# Patient Record
Sex: Male | Born: 1980 | Race: White | Hispanic: No | Marital: Married | Smoking: Former smoker
Health system: Southern US, Community
[De-identification: ages and names within clinical notes are randomized; demographics above are authoritative.]

## PROBLEM LIST (undated history)

## (undated) DIAGNOSIS — N200 Calculus of kidney: Secondary | ICD-10-CM

## (undated) DIAGNOSIS — I10 Essential (primary) hypertension: Secondary | ICD-10-CM

## (undated) DIAGNOSIS — F319 Bipolar disorder, unspecified: Secondary | ICD-10-CM

## (undated) DIAGNOSIS — N289 Disorder of kidney and ureter, unspecified: Secondary | ICD-10-CM

## (undated) DIAGNOSIS — E119 Type 2 diabetes mellitus without complications: Secondary | ICD-10-CM

---

## 2005-10-21 ENCOUNTER — Emergency Department (HOSPITAL_COMMUNITY): Admission: EM | Admit: 2005-10-21 | Discharge: 2005-10-21 | Payer: Self-pay | Admitting: Emergency Medicine

## 2011-11-20 DIAGNOSIS — F319 Bipolar disorder, unspecified: Secondary | ICD-10-CM | POA: Insufficient documentation

## 2015-10-10 DIAGNOSIS — Z8669 Personal history of other diseases of the nervous system and sense organs: Secondary | ICD-10-CM | POA: Insufficient documentation

## 2015-10-10 DIAGNOSIS — Z8709 Personal history of other diseases of the respiratory system: Secondary | ICD-10-CM | POA: Insufficient documentation

## 2015-10-10 DIAGNOSIS — I10 Essential (primary) hypertension: Secondary | ICD-10-CM | POA: Insufficient documentation

## 2017-04-30 DIAGNOSIS — K219 Gastro-esophageal reflux disease without esophagitis: Secondary | ICD-10-CM | POA: Insufficient documentation

## 2017-06-02 DIAGNOSIS — E119 Type 2 diabetes mellitus without complications: Secondary | ICD-10-CM | POA: Insufficient documentation

## 2017-06-19 ENCOUNTER — Encounter (HOSPITAL_COMMUNITY): Payer: Self-pay | Admitting: Emergency Medicine

## 2017-06-19 ENCOUNTER — Ambulatory Visit (HOSPITAL_COMMUNITY)
Admission: EM | Admit: 2017-06-19 | Discharge: 2017-06-19 | Disposition: A | Discharge status: Home / Self Care | Payer: BLUE CROSS/BLUE SHIELD | Attending: Internal Medicine | Admitting: Internal Medicine

## 2017-06-19 ENCOUNTER — Other Ambulatory Visit: Payer: Self-pay

## 2017-06-19 DIAGNOSIS — B029 Zoster without complications: Secondary | ICD-10-CM

## 2017-06-19 HISTORY — DX: Essential (primary) hypertension: I10

## 2017-06-19 HISTORY — DX: Bipolar disorder, unspecified: F31.9

## 2017-06-19 HISTORY — DX: Type 2 diabetes mellitus without complications: E11.9

## 2017-06-19 HISTORY — DX: Disorder of kidney and ureter, unspecified: N28.9

## 2017-06-19 HISTORY — DX: Calculus of kidney: N20.0

## 2017-06-19 MED ORDER — VALACYCLOVIR HCL 1 G PO TABS: 1000.0000 mg | ORAL_TABLET | Freq: Three times a day (TID) | ORAL | 0 refills | Status: AC

## 2017-06-19 MED ORDER — IBUPROFEN 800 MG PO TABS: 800.0000 mg | ORAL_TABLET | Freq: Three times a day (TID) | ORAL | 0 refills | Status: DC | PRN

## 2017-06-19 NOTE — Discharge Instructions (Addendum)
Recommend start Valtrex 1000mg  3 times a day as directed for 7 days. May take Ibuprofen 800mg  every 8 hours as needed for pain. May cover area for comfort. May clean and wash as usual. Follow-up with your PCP or here in 4 to 5 days if not improving.

## 2017-06-19 NOTE — ED Triage Notes (Signed)
Pt c/o rash on his L upper rib cage area x4 days.

## 2017-06-19 NOTE — ED Provider Notes (Signed)
[facility]    CSN: [id number] Arrival date & time: 06/19/17  4098     History   Chief Complaint Chief Complaint  Patient presents with  . Rash    HPI Mark Lindsey is a 37 y.o. male.   37 year old male presents with rash on his left lower rib area on his thoracic and side area of his back for the past 4 days. Rash has slowly getting larger and has become more painful. Slightly itchy. Very sensitive if clothing touches area. No drainage or discharge. No exposure to different soaps, detergents. Has tried Hydrocortisone cream and Calamine lotion with no relief. Has history of chicken pox as a child. Denies any fever, URI or GI symptoms. Other chronic conditions include HTN, DM, GERD and bipolar disorder and takes multiple medications for management. Has been under increased stress lately working multiple jobs and getting little sleep.    The history is provided by the patient.    Past Medical History:  Diagnosis Date  . Bipolar disease, chronic (HCC)   . Diabetes mellitus without complication (HCC)   . Hypertension   . Kidney stones   . Renal disorder     There are no active problems to display for this patient.   History reviewed. No pertinent surgical history.     Home Medications    Prior to Admission medications   Medication Sig Start Date End Date Taking? Authorizing Provider  GLIPIZIDE PO Take by mouth.   Yes [provider]  LamoTRIgine (LAMICTAL PO) Take by mouth.   Yes [provider]  lisinopril (PRINIVIL,ZESTRIL) 20 MG tablet Take 20 mg by mouth daily.   Yes [provider]  METFORMIN HCL PO Take by mouth.   Yes [provider]  Omeprazole (PRILOSEC PO) Take by mouth.   Yes [provider]  ibuprofen (ADVIL,MOTRIN) 800 MG tablet Take 1 tablet (800 mg total) by mouth every 8 (eight) hours as needed for moderate pain. 06/19/17   Sudie Grumbling, NP  valACYclovir (VALTREX) 1000 MG tablet Take 1 tablet  (1,000 mg total) by mouth 3 (three) times daily for 7 days. 06/19/17 06/26/17  Sudie Grumbling, NP    Family History No family history on file.  Social History Social History   Tobacco Use  . Smoking status: Never Smoker  Substance Use Topics  . Alcohol use: No    Frequency: Never  . Drug use: Not on file     Allergies   Septra [sulfamethoxazole-trimethoprim]; Seroquel [quetiapine]; and Sulfa antibiotics   Review of Systems Review of Systems  Constitutional: Positive for fatigue. Negative for activity change, appetite change, chills and fever.  HENT: Negative for congestion, mouth sores, rhinorrhea and sore throat.   Respiratory: Negative for cough, chest tightness, shortness of breath and wheezing.   Gastrointestinal: Negative for abdominal pain, diarrhea, nausea and vomiting.  Genitourinary: Positive for flank pain (from rash). Negative for decreased urine volume and dysuria.  Musculoskeletal: Positive for back pain. Negative for arthralgias, myalgias and neck pain.  Skin: Positive for rash. Negative for wound.  Allergic/Immunologic: Negative for immunocompromised state.  Neurological: Negative for dizziness, tremors, seizures, syncope, weakness, light-headedness, numbness and headaches.  Hematological: Negative for adenopathy. Does not bruise/bleed easily.     Physical Exam Triage Vital Signs ED Triage Vitals  Enc Vitals Group     BP 06/19/17 1008 (!) 155/96     Pulse Rate 06/19/17 1008 100     Resp 06/19/17 1008 18  Temp 06/19/17 1008 98 F (36.7 C)     Temp src --      SpO2 06/19/17 1008 100 %     Weight --      Height --      Head Circumference --      Peak Flow --      Pain Score 06/19/17 1009 7     Pain Loc --      Pain Edu? --      Excl. in GC? --    No data found.  Updated Vital Signs BP (!) 155/96   Pulse 100   Temp 98 F (36.7 C)   Resp 18   SpO2 100%   Visual Acuity Right Eye Distance:   Left Eye Distance:   Bilateral Distance:     Right Eye Near:   Left Eye Near:    Bilateral Near:     Physical Exam  Constitutional: He is oriented to person, place, and time. He appears well-developed and well-nourished. No distress.  Patient sitting on exam table and appear uncomfortable due to pain of rash.  HENT:  Head: Normocephalic and atraumatic.  Right Ear: Hearing and external ear normal.  Left Ear: Hearing and external ear normal.  Nose: Nose normal.  Eyes: Conjunctivae and EOM are normal.  Neck: Normal range of motion.  Cardiovascular: Normal rate, regular rhythm and normal heart sounds.  No murmur heard. Pulmonary/Chest: Effort normal and breath sounds normal. No stridor. No respiratory distress. He has no decreased breath sounds. He has no wheezes. He has no rhonchi. He has no rales.      Red, raised, fluid-filled lesions present along left lower rib area from thoracic area on back to mid-axillary area about 5cm in length. Follows dermatome. No discharge or crusting. Very tender. No surrounding erythema.   Musculoskeletal: Normal range of motion.  Neurological: He is alert and oriented to person, place, and time.  Skin: Skin is warm, dry and intact. Rash noted. Rash is vesicular.  Psychiatric: He has a normal mood and affect. His speech is normal and behavior is normal. Judgment and thought content normal. Cognition and memory are normal.     UC Treatments / Results  Labs (all labs ordered are listed, but only abnormal results are displayed) Labs Reviewed - No data to display  EKG  EKG Interpretation None       Radiology No results found.  Procedures Procedures (including critical care time)  Medications Ordered in UC Medications - No data to display   Initial Impression / Assessment and Plan / UC Course  I have reviewed the triage vital signs and the nursing notes.  Pertinent labs & imaging results that were available during my care of the patient were reviewed by me and considered in my  medical decision making (see chart for details).     Discussed with patient that he probably has Shingles (Zoster). Recommend start Valtrex 1000mg  3 times a day as directed for 7 days. May take Ibuprofen 800mg  every 8 hours as needed for pain. May cover area for comfort. May clean and wash as usual. Note written for work discussing contagious nature of Shingles for those who have not had chicken pox. Follow-up with his PCP or here in 4 to 5 days if not improving.    Final Clinical Impressions(s) / UC Diagnoses   Final diagnoses:  Herpes zoster without complication    ED Discharge Orders        Ordered  ibuprofen (ADVIL,MOTRIN) 800 MG tablet  Every 8 hours PRN     06/19/17 1032    valACYclovir (VALTREX) 1000 MG tablet  3 times daily     06/19/17 1032       Controlled Substance Prescriptions [place] Controlled Substance Registry consulted? Not Applicable   Sudie Grumbling, NP 06/20/17 [phone]

## 2018-04-02 ENCOUNTER — Encounter: Payer: Self-pay | Admitting: Podiatry

## 2018-04-02 ENCOUNTER — Ambulatory Visit (INDEPENDENT_AMBULATORY_CARE_PROVIDER_SITE_OTHER): Payer: BLUE CROSS/BLUE SHIELD | Admitting: Podiatry

## 2018-04-02 VITALS — BP 140/98 | HR 113 | Resp 16

## 2018-04-02 DIAGNOSIS — L600 Ingrowing nail: Secondary | ICD-10-CM | POA: Diagnosis not present

## 2018-04-02 DIAGNOSIS — L03031 Cellulitis of right toe: Secondary | ICD-10-CM

## 2018-04-02 NOTE — Patient Instructions (Signed)

## 2018-04-02 NOTE — Progress Notes (Signed)
   Subjective:    Patient ID: Mark MoloneyMichael W Lindsey, male    DOB: May 04, 1980, 37 y.o.   MRN: [id number]  HPI    Review of Systems  All other systems reviewed and are negative.      Objective:   Physical Exam        Assessment & Plan:

## 2018-04-03 NOTE — Progress Notes (Signed)
Subjective:   Patient ID: Mark Lindsey, male   DOB: 37 y.o.   MRN: [id number]   HPI Patient presents stating he is a long-term diabetic and is developed an infection in his right big toe around the nails and was placed on an antibiotic by his doctor but he continues to experience drainage.  States it is not sore and he has been in good control before but was not able to take medicine for a period of time.  Patient does not smoke and likes to be active   Review of Systems  All other systems reviewed and are negative.       Objective:  Physical Exam Vitals signs and nursing note reviewed.  Constitutional:      Appearance: He is well-developed.  Pulmonary:     Effort: Pulmonary effort is normal.  Musculoskeletal: Normal range of motion.  Skin:    General: Skin is warm.  Neurological:     Mental Status: He is alert.     Neurovascular status intact muscle strength is adequate range of motion within normal limits with patient noted to have drainage and redness with necrotic tissue on the medial lateral side of the right hallux nail bed.  It is both and not just one and there is no proximal edema erythema drainage noted currently or no odor or active drainage for culture     Assessment:  Significant paronychia infection of the right hallux both medial lateral border with ingrown component to the nailbeds themselves     Plan:  H&P condition reviewed and explained in great length.  At this point I would rather do more conservative treatment and I went ahead and I infiltrated the hallux 60 mg Xylocaine Marcaine mixture I remove the medial lateral border removed all proud flesh abscess tissue created channels for drainage and flushed the wounds.  Applied sterile dressing instructed on soaks and continuing antibiotic and at one point future will need to consider permanent procedure but not until he can get his sugar under better control which I encouraged him to do and he promises he will  do

## 2019-06-12 ENCOUNTER — Other Ambulatory Visit: Payer: Self-pay

## 2019-06-12 ENCOUNTER — Ambulatory Visit
Admission: EM | Admit: 2019-06-12 | Discharge: 2019-06-12 | Disposition: A | Discharge status: Home / Self Care | Payer: Self-pay | Attending: Physician Assistant | Admitting: Physician Assistant

## 2019-06-12 ENCOUNTER — Encounter: Payer: Self-pay | Admitting: Emergency Medicine

## 2019-06-12 DIAGNOSIS — N23 Unspecified renal colic: Secondary | ICD-10-CM

## 2019-06-12 LAB — POCT URINALYSIS DIP (MANUAL ENTRY)
Bilirubin, UA: NEGATIVE
Glucose, UA: 500 mg/dL — AB
Leukocytes, UA: NEGATIVE
Nitrite, UA: NEGATIVE
Protein Ur, POC: NEGATIVE mg/dL
Spec Grav, UA: 1.025 (ref 1.010–1.025)
Urobilinogen, UA: 0.2 E.U./dL
pH, UA: 5.5 (ref 5.0–8.0)

## 2019-06-12 MED ORDER — ONDANSETRON 4 MG PO TBDP: 4.0000 mg | ORAL_TABLET | Freq: Three times a day (TID) | ORAL | 0 refills | Status: DC | PRN

## 2019-06-12 MED ORDER — TAMSULOSIN HCL 0.4 MG PO CAPS: 0.4000 mg | ORAL_CAPSULE | Freq: Every day | ORAL | 0 refills | Status: DC

## 2019-06-12 MED ORDER — KETOROLAC TROMETHAMINE 10 MG PO TABS: 10.0000 mg | ORAL_TABLET | Freq: Four times a day (QID) | ORAL | 0 refills | Status: DC | PRN

## 2019-06-12 NOTE — Discharge Instructions (Signed)
Urine without infection, does show blood and glucose. Start flomax for kidney stone. Toradol as needed for pain. Zofran as needed for nausea. Keep hydrated, urine should be clear to pale yellow in color. Follow up with PCP/urology if symptoms not improving. If sudden significant pain, nausea/vomiting not controlled by medicine, go to the ED for further evaluation.

## 2019-06-12 NOTE — ED Provider Notes (Signed)
[facility]    CSN: [id number] Arrival date & time: 06/12/19  1028      History   Chief Complaint Chief Complaint  Patient presents with  . Appointment    1030  . Flank Pain    HPI Mark Lindsey is a 39 y.o. male.   39 year old male comes in for 3 day history of left flank pain. Pain is intermittent, and states is moving down the back. Has history of kidney stones and feels the same. Mild nausea at times, no vomiting. Denies abdominal pain, diarrhea. Had urinary frequency without dysuria, hematuria. Denies fever, chills, body aches. Denies penile pain, discharge.      Past Medical History:  Diagnosis Date  . Bipolar disease, chronic ([place])   . Diabetes mellitus without complication ([place])   . Hypertension   . Kidney stones   . Renal disorder     Patient Active Problem List   Diagnosis Date Noted  . Diabetes mellitus ([place]) 06/02/2017  . Gastroesophageal reflux disease without esophagitis 04/30/2017  . Essential hypertension 10/10/2015  . History of asthma 10/10/2015  . History of migraine headaches 10/10/2015  . Bipolar disorder, unspecified ([place]) 11/20/2011    History reviewed. No pertinent surgical history.     Home Medications    Prior to Admission medications   Medication Sig Start Date End Date Taking? Authorizing Provider  ALPRAZolam (XANAX) 0.25 MG tablet Xanax  1 tablet daily as needed    [provider]  glimepiride (AMARYL) 2 MG tablet TAKE 1 TABLET BY MOUTH 30 MINUTES BEFORE BREAKFAST. 03/21/18   [provider]  GLIPIZIDE PO Take by mouth.    [provider]  hydrOXYzine (ATARAX/VISTARIL) 25 MG tablet Take by mouth. 03/31/18   [provider]  ibuprofen (ADVIL,MOTRIN) 800 MG tablet Take 1 tablet (800 mg total) by mouth every 8 (eight) hours as needed for moderate pain. 06/19/17   Katy Apo, NP  ketorolac (TORADOL) 10 MG tablet Take 1 tablet (10 mg total) by mouth every 6 (six) hours as needed.  06/12/19   Tasia Catchings, Friedrich Harriott V, PA-C  LamoTRIgine (LAMICTAL PO) Take by mouth.    [provider]  lisinopril (PRINIVIL,ZESTRIL) 20 MG tablet Take 20 mg by mouth daily.    [provider]  metFORMIN (GLUCOPHAGE) 500 MG tablet metformin  500 mg twice daily    [provider]  METFORMIN HCL PO Take by mouth.    [provider]  Omeprazole (PRILOSEC PO) Take by mouth.     [provider]  ondansetron (ZOFRAN ODT) 4 MG disintegrating tablet Take 1 tablet (4 mg total) by mouth every 8 (eight) hours as needed for nausea or vomiting. 06/12/19   Tasia Catchings, Koston Hennes V, PA-C  tamsulosin (FLOMAX) 0.4 MG CAPS capsule Take 1 capsule (0.4 mg total) by mouth daily. 06/12/19   Ok Edwards, PA-C    Family History Family History  Problem Relation Age of Onset  . Diabetes Mother   . Hypertension Mother     Social History Social History   Tobacco Use  . Smoking status: Former Smoker    Types: Cigarettes  . Smokeless tobacco: Never Used  Substance Use Topics  . Alcohol use: No  . Drug use: Not on file     Allergies   Buspirone, Quetiapine fumarate, Septra [sulfamethoxazole-trimethoprim], Seroquel [quetiapine], and Sulfa antibiotics   Review of Systems Review of Systems  Reason unable to perform ROS: See HPI as above.  Physical Exam Triage Vital Signs ED Triage Vitals  Enc Vitals Group     BP 06/12/19 1138 123/90     Pulse Rate 06/12/19 1138 91     Resp 06/12/19 1138 18     Temp 06/12/19 1138 98.3 F (36.8 C)     Temp Source 06/12/19 1138 Oral     SpO2 06/12/19 1138 95 %     Weight --      Height --      Head Circumference --      Peak Flow --      Pain Score 06/12/19 1139 6     Pain Loc --      Pain Edu? --      Excl. in GC? --    No data found.  Updated Vital Signs BP 123/90 (BP Location: Left Arm)   Pulse 91   Temp 98.3 F (36.8 C) (Oral)   Resp 18   SpO2 95%   Physical Exam Constitutional:      General: He is not in acute distress.     Appearance: Normal appearance. He is well-developed. He is not toxic-appearing or diaphoretic.  HENT:     Head: Normocephalic and atraumatic.  Eyes:     Conjunctiva/sclera: Conjunctivae normal.     Pupils: Pupils are equal, round, and reactive to light.  Cardiovascular:     Rate and Rhythm: Normal rate and regular rhythm.  Pulmonary:     Effort: Pulmonary effort is normal. No respiratory distress.     Comments: Speaking in full sentences without difficulty. LCTAB Abdominal:     Tenderness: There is left CVA tenderness. There is no right CVA tenderness.  Musculoskeletal:     Cervical back: Normal range of motion and neck supple.     Comments: No tenderness to palpation  Skin:    General: Skin is warm and dry.  Neurological:     Mental Status: He is alert and oriented to person, place, and time.      UC Treatments / Results  Labs (all labs ordered are listed, but only abnormal results are displayed) Labs Reviewed  POCT URINALYSIS DIP (MANUAL ENTRY) - Abnormal; Notable for the following components:      Result Value   Glucose, UA =500 (*)    Ketones, POC UA small (15) (*)    Blood, UA trace-intact (*)    All other components within normal limits    EKG   Radiology No results found.  Procedures Procedures (including critical care time)  Medications Ordered in UC Medications - No data to display  Initial Impression / Assessment and Plan / UC Course  I have reviewed the triage vital signs and the nursing notes.  Pertinent labs & imaging results that were available during my care of the patient were reviewed by me and considered in my medical decision making (see chart for details).    History and exam consistent with renal stone causing colic. Patient states pain not severe and deferred toradol IM during visit. Will provide flomax, toradol PO, zofran for now. If needing further pain relief, to call us back. Push fluids. Return precautions given. Patient expresses  understanding and agrees to plan.   Patient with history of DM, had lapsed on medication due to insurance, recently restarted medications. Will have patient monitor CBG for now.  Final Clinical Impressions(s) / UC Diagnoses   Final diagnoses:  Renal colic on left side    ED Prescriptions    Medication Sig Dispense  Auth. Provider   ondansetron (ZOFRAN ODT) 4 MG disintegrating tablet Take 1 tablet (4 mg total) by mouth every 8 (eight) hours as needed for nausea or vomiting. 20 tablet Jakyia Gaccione V, PA-C   tamsulosin (FLOMAX) 0.4 MG CAPS capsule Take 1 capsule (0.4 mg total) by mouth daily. 10 capsule Cathie Hoops, Hashir Deleeuw V, PA-C   ketorolac (TORADOL) 10 MG tablet Take 1 tablet (10 mg total) by mouth every 6 (six) hours as needed. 20 tablet Belinda Fisher, PA-C     I have reviewed the PDMP during this encounter.   Belinda Fisher, PA-C 06/12/19 1408

## 2019-06-12 NOTE — ED Triage Notes (Signed)
Pt here for left flank pain x 3 days with hx of kidney stone and sts feels better

## 2019-06-14 ENCOUNTER — Telehealth: Payer: Self-pay | Admitting: Emergency Medicine

## 2019-06-14 MED ORDER — TRAMADOL HCL 50 MG PO TABS: 50.0000 mg | ORAL_TABLET | Freq: Three times a day (TID) | ORAL | 0 refills | Status: DC | PRN

## 2019-06-14 NOTE — Telephone Encounter (Signed)
rx sent

## 2019-06-14 NOTE — Telephone Encounter (Signed)
When this RN spoke to patient on the phone, also discussed Amy, APP's recommendations of  Following up with PCP in 7 days from symptom onset if not improving or if he has not passed the stone.  ER precautions reviewed.

## 2019-06-14 NOTE — Telephone Encounter (Signed)
Patient called stating he would like to have a stronger pain medication (already discussed with Amy, APP on Sunday during his visit) since the medications he is using at home are not treating his pain.  Spoke to Amy, APP who okay'd Tramadol 50mg  every 8 hours as needed, 15 pills.  Will ask aPP on site today to prescribe due to being a controlled substance.

## 2019-07-04 ENCOUNTER — Ambulatory Visit
Admission: EM | Admit: 2019-07-04 | Discharge: 2019-07-04 | Disposition: A | Discharge status: Home / Self Care | Payer: Self-pay | Attending: Physician Assistant | Admitting: Physician Assistant

## 2019-07-04 ENCOUNTER — Encounter: Payer: Self-pay | Admitting: Emergency Medicine

## 2019-07-04 ENCOUNTER — Other Ambulatory Visit: Payer: Self-pay

## 2019-07-04 DIAGNOSIS — Z20822 Contact with and (suspected) exposure to covid-19: Secondary | ICD-10-CM

## 2019-07-04 DIAGNOSIS — I10 Essential (primary) hypertension: Secondary | ICD-10-CM

## 2019-07-04 DIAGNOSIS — R0982 Postnasal drip: Secondary | ICD-10-CM

## 2019-07-04 DIAGNOSIS — J392 Other diseases of pharynx: Secondary | ICD-10-CM

## 2019-07-04 MED ORDER — AZELASTINE HCL 0.1 % NA SOLN: 2.0000 | Freq: Two times a day (BID) | NASAL | 0 refills | Status: DC

## 2019-07-04 NOTE — Discharge Instructions (Signed)
COVID PCR testing ordered. I would like you to quarantine until testing results. You can take over the counter flonase/nasacort to help with nasal congestion/drainage. Tylenol/motrin for pain and fever. Keep hydrated, urine should be clear to pale yellow in color. If experiencing shortness of breath, trouble breathing, go to the emergency department for further evaluation needed.   As discussed, given symptoms started few hours ago, if develop more or worsening symptoms, will need retesting.

## 2019-07-04 NOTE — ED Notes (Signed)
Patient able to ambulate independently  

## 2019-07-04 NOTE — ED Provider Notes (Signed)
[facility]    CSN: [id number] Arrival date & time: 07/04/19  1647      History   Chief Complaint Chief Complaint  Patient presents with  . URI    HPI Mark Lindsey is a 39 y.o. male.   39 year old male comes in for few hour history of URI symptoms. States was working when he started having throat irritation, sinus pressure, "throat clearing" cough. Given symptoms, work requested testing. Denies fever, chills, body aches. Denies abdominal pain, nausea, vomiting, diarrhea. Denies shortness of breath, loss of taste/smell.      Past Medical History:  Diagnosis Date  . Bipolar disease, chronic (HCC)   . Diabetes mellitus without complication (HCC)   . Hypertension   . Kidney stones   . Renal disorder     Patient Active Problem List   Diagnosis Date Noted  . Diabetes mellitus (HCC) 06/02/2017  . Gastroesophageal reflux disease without esophagitis 04/30/2017  . Essential hypertension 10/10/2015  . History of asthma 10/10/2015  . History of migraine headaches 10/10/2015  . Bipolar disorder, unspecified (HCC) 11/20/2011    History reviewed. No pertinent surgical history.     Home Medications    Prior to Admission medications   Medication Sig Start Date End Date Taking? Authorizing Provider  ALPRAZolam Prudy Feeler) 0.25 MG tablet Xanax  1 tablet daily as needed    [provider]  azelastine (ASTELIN) 0.1 % nasal spray Place 2 sprays into both nostrils 2 (two) times daily. 07/04/19   Yunuen Mordan V, PA-C  glimepiride (AMARYL) 2 MG tablet TAKE 1 TABLET BY MOUTH 30 MINUTES BEFORE BREAKFAST. 03/21/18   [provider]  GLIPIZIDE PO Take by mouth.    [provider]  hydrOXYzine (ATARAX/VISTARIL) 25 MG tablet Take by mouth. 03/31/18   [provider]  ibuprofen (ADVIL,MOTRIN) 800 MG tablet Take 1 tablet (800 mg total) by mouth every 8 (eight) hours as needed for moderate pain. 06/19/17   Sudie Grumbling, NP  LamoTRIgine (LAMICTAL  PO) Take by mouth.    [provider]  lisinopril (PRINIVIL,ZESTRIL) 20 MG tablet Take 20 mg by mouth daily.    [provider]  metFORMIN (GLUCOPHAGE) 500 MG tablet metformin  500 mg twice daily    [provider]  METFORMIN HCL PO Take by mouth.    [provider]  Omeprazole (PRILOSEC PO) Take by mouth.     [provider]  ondansetron (ZOFRAN ODT) 4 MG disintegrating tablet Take 1 tablet (4 mg total) by mouth every 8 (eight) hours as needed for nausea or vomiting. 06/12/19   Belinda Fisher, PA-C    Family History Family History  Problem Relation Age of Onset  . Diabetes Mother   . Hypertension Mother     Social History Social History   Tobacco Use  . Smoking status: Former Smoker    Types: Cigarettes  . Smokeless tobacco: Never Used  Substance Use Topics  . Alcohol use: No  . Drug use: Not on file     Allergies   Buspirone, Quetiapine fumarate, Septra [sulfamethoxazole-trimethoprim], Seroquel [quetiapine], and Sulfa antibiotics   Review of Systems Review of Systems  Reason unable to perform ROS: See HPI as above.     Physical Exam Triage Vital Signs ED Triage Vitals [07/04/19 1658]  Enc Vitals Group     BP      Pulse      Resp      Temp  Temp src      SpO2      Weight      Height      Head Circumference      Peak Flow      Pain Score 0     Pain Loc      Pain Edu?      Excl. in GC?    No data found.  Updated Vital Signs BP (!) 149/100 (BP Location: Left Arm)   Pulse 98   Temp 98.2 F (36.8 C)   Resp 18   SpO2 95%   Visual Acuity Right Eye Distance:   Left Eye Distance:   Bilateral Distance:    Right Eye Near:   Left Eye Near:    Bilateral Near:     Physical Exam Constitutional:      General: He is not in acute distress.    Appearance: Normal appearance. He is not ill-appearing, toxic-appearing or diaphoretic.  HENT:     Head: Normocephalic and atraumatic.     Right Ear: Tympanic membrane,  ear canal and external ear normal. Tympanic membrane is not erythematous or bulging.     Left Ear: Tympanic membrane, ear canal and external ear normal. Tympanic membrane is not erythematous or bulging.     Mouth/Throat:     Mouth: Mucous membranes are moist.     Pharynx: Oropharynx is clear. Uvula midline.  Cardiovascular:     Rate and Rhythm: Normal rate and regular rhythm.     Heart sounds: Normal heart sounds. No murmur. No friction rub. No gallop.   Pulmonary:     Effort: Pulmonary effort is normal. No accessory muscle usage, prolonged expiration, respiratory distress or retractions.     Comments: Lungs clear to auscultation without adventitious lung sounds. Musculoskeletal:     Cervical back: Normal range of motion and neck supple.  Neurological:     General: No focal deficit present.     Mental Status: He is alert and oriented to person, place, and time.     UC Treatments / Results  Labs (all labs ordered are listed, but only abnormal results are displayed) Labs Reviewed  NOVEL CORONAVIRUS, NAA    EKG   Radiology No results found.  Procedures Procedures (including critical care time)  Medications Ordered in UC Medications - No data to display  Initial Impression / Assessment and Plan / UC Course  I have reviewed the triage vital signs and the nursing notes.  Pertinent labs & imaging results that were available during my care of the patient were reviewed by me and considered in my medical decision making (see chart for details).    Discussed given few hour history of symptom onset, testing may not be accurate at this time. However, work is requiring testing. Discussed if symptoms worsen, may need retesting regardless of testing result. Patient expresses understanding and would like to proceed. COVID PCR test ordered. Patient to quarantine until testing results return. No alarming signs on exam.  Patient speaking in full sentences without respiratory distress.   Symptomatic treatment discussed.  Push fluids.  Return precautions given.  Patient expresses understanding and agrees to plan.  Final Clinical Impressions(s) / UC Diagnoses   Final diagnoses:  Throat irritation  Post-nasal drip    ED Prescriptions    Medication Sig Dispense Auth. Provider   azelastine (ASTELIN) 0.1 % nasal spray Place 2 sprays into both nostrils 2 (two) times daily. 30 mL Lurline Idol     PDMP  not reviewed this encounter.   Ok Edwards, PA-C 07/04/19 1722

## 2019-07-04 NOTE — ED Triage Notes (Signed)
Pt presents to [facility] for assessment of scratchy throat, sinus pressure, and "throat clearing" cough.  Patient states his boss sent him here for a COVID test.

## 2019-07-05 LAB — NOVEL CORONAVIRUS, NAA: SARS-CoV-2, NAA: NOT DETECTED

## 2019-07-17 DIAGNOSIS — D696 Thrombocytopenia, unspecified: Secondary | ICD-10-CM | POA: Insufficient documentation

## 2019-07-17 DIAGNOSIS — E139 Other specified diabetes mellitus without complications: Secondary | ICD-10-CM | POA: Insufficient documentation

## 2019-07-17 DIAGNOSIS — U071 COVID-19: Secondary | ICD-10-CM | POA: Insufficient documentation

## 2019-07-17 DIAGNOSIS — R7989 Other specified abnormal findings of blood chemistry: Secondary | ICD-10-CM | POA: Insufficient documentation

## 2019-10-13 ENCOUNTER — Ambulatory Visit
Admission: EM | Admit: 2019-10-13 | Discharge: 2019-10-13 | Disposition: A | Discharge status: Home / Self Care | Payer: No Typology Code available for payment source | Attending: Emergency Medicine | Admitting: Emergency Medicine

## 2019-10-13 ENCOUNTER — Other Ambulatory Visit: Payer: Self-pay

## 2019-10-13 DIAGNOSIS — J029 Acute pharyngitis, unspecified: Secondary | ICD-10-CM

## 2019-10-13 MED ORDER — LEVOCETIRIZINE DIHYDROCHLORIDE 5 MG PO TABS: 5.0000 mg | ORAL_TABLET | Freq: Every evening | ORAL | 0 refills | Status: DC

## 2019-10-13 MED ORDER — FLUTICASONE PROPIONATE 50 MCG/ACT NA SUSP: 1.0000 | Freq: Every day | NASAL | 0 refills | Status: DC

## 2019-10-13 NOTE — ED Provider Notes (Signed)
[facility]    CSN: [id number] Arrival date & time: 10/13/19  0840      History   Chief Complaint Chief Complaint  Patient presents with  . Sore Throat    HPI Mark Lindsey is a 39 y.o. male with history of hypertension, obesity, bipolar disorder, diabetes presenting for sore throat since last night.  Took Benadryl without relief.  No known sick contacts.  Patient did have Covid at the end of March, for which he was hospitalized.  Has been doing well since discharge.  Has not had negative test since.  Did complete Covid vaccination 3 weeks ago.  No fever, difficulty breathing or swelling, dental pain, ear pain, chest pain, change in breathing.  Past Medical History:  Diagnosis Date  . Bipolar disease, chronic ([place])   . Diabetes mellitus without complication ([place])   . Hypertension   . Kidney stones   . Renal disorder     Patient Active Problem List   Diagnosis Date Noted  . Diabetes mellitus ([place]) 06/02/2017  . Gastroesophageal reflux disease without esophagitis 04/30/2017  . Essential hypertension 10/10/2015  . History of asthma 10/10/2015  . History of migraine headaches 10/10/2015  . Bipolar disorder, unspecified ([place]) 11/20/2011    History reviewed. No pertinent surgical history.     Home Medications    Prior to Admission medications   Medication Sig Start Date End Date Taking? Authorizing Provider  ALPRAZolam Duanne Moron) 0.25 MG tablet Xanax  1 tablet daily as needed    [provider]  azelastine (ASTELIN) 0.1 % nasal spray Place 2 sprays into both nostrils 2 (two) times daily. 07/04/19   Tasia Catchings, Amy V, PA-C  fluticasone (FLONASE) 50 MCG/ACT nasal spray Place 1 spray into both nostrils daily. 10/13/19   Hall-Potvin, [place], PA-C  glimepiride (AMARYL) 2 MG tablet TAKE 1 TABLET BY MOUTH 30 MINUTES BEFORE BREAKFAST. 03/21/18   [provider]  GLIPIZIDE PO Take by mouth.    [provider]  ibuprofen (ADVIL,MOTRIN) 800 MG tablet  Take 1 tablet (800 mg total) by mouth every 8 (eight) hours as needed for moderate pain. 06/19/17   Katy Apo, NP  LamoTRIgine (LAMICTAL PO) Take by mouth.    [provider]  levocetirizine (XYZAL) 5 MG tablet Take 1 tablet (5 mg total) by mouth every evening. 10/13/19   Hall-Potvin, [place], PA-C  lisinopril (PRINIVIL,ZESTRIL) 20 MG tablet Take 20 mg by mouth daily.    [provider]  metFORMIN (GLUCOPHAGE) 500 MG tablet metformin  500 mg twice daily    [provider]  METFORMIN HCL PO Take by mouth.    [provider]  Omeprazole (PRILOSEC PO) Take by mouth.     [provider]    Family History Family History  Problem Relation Age of Onset  . Diabetes Mother   . Hypertension Mother     Social History Social History   Tobacco Use  . Smoking status: Former Smoker    Types: Cigarettes  . Smokeless tobacco: Never Used  Substance Use Topics  . Alcohol use: No  . Drug use: Not Currently     Allergies   Buspirone, Quetiapine fumarate, Septra [sulfamethoxazole-trimethoprim], Seroquel [quetiapine], and Sulfa antibiotics   Review of Systems As per HPI   Physical Exam Triage Vital Signs ED Triage Vitals  Enc Vitals Group     BP      Pulse      Resp      Temp  Temp src      SpO2      Weight      Height      Head Circumference      Peak Flow      Pain Score      Pain Loc      Pain Edu?      Excl. in GC?    No data found.  Updated Vital Signs BP (!) 160/100 (BP Location: Left Arm)   Pulse 99   Temp 98.2 F (36.8 C) (Oral)   Resp 18   SpO2 96%   Visual Acuity Right Eye Distance:   Left Eye Distance:   Bilateral Distance:    Right Eye Near:   Left Eye Near:    Bilateral Near:     Physical Exam Constitutional:      General: He is not in acute distress. HENT:     Head: Normocephalic and atraumatic.     Right Ear: Tympanic membrane, ear canal and external ear normal.     Left Ear: Tympanic  membrane, ear canal and external ear normal.     Nose: No nasal deformity, congestion or rhinorrhea.     Comments: Turbinates nonedematous bilaterally with pink mucosa    Mouth/Throat:     Mouth: Mucous membranes are moist.     Tongue: Tongue does not deviate from midline.     Pharynx: Oropharynx is clear. Uvula midline.     Comments: No tonsillar hypertrophy or exudate.  Cobblestoning present Eyes:     General: No scleral icterus.    Conjunctiva/sclera: Conjunctivae normal.     Pupils: Pupils are equal, round, and reactive to light.  Cardiovascular:     Rate and Rhythm: Normal rate.  Pulmonary:     Effort: Pulmonary effort is normal. No respiratory distress.     Breath sounds: No wheezing.  Musculoskeletal:     Cervical back: Normal range of motion and neck supple. No muscular tenderness.  Lymphadenopathy:     Cervical: No cervical adenopathy.  Neurological:     Mental Status: He is alert.      UC Treatments / Results  Labs (all labs ordered are listed, but only abnormal results are displayed) Labs Reviewed - No data to display  EKG   Radiology No results found.  Procedures Procedures (including critical care time)  Medications Ordered in UC Medications - No data to display  Initial Impression / Assessment and Plan / UC Course  I have reviewed the triage vital signs and the nursing notes.  Pertinent labs & imaging results that were available during my care of the patient were reviewed by me and considered in my medical decision making (see chart for details).     Patient afebrile, nontoxic, with SpO2 96%.  Will defer repeat Covid testing as patient is s/p vaccination and infection < 3 months ago.  Does not meet [facility] criteria for streptococcal testing.  We will treat supportively as outlined below.  Return precautions discussed, patient verbalized understanding and is agreeable to plan. Final Clinical Impressions(s) / UC Diagnoses   Final diagnoses:  Sore  throat     Discharge Instructions     Your rapid strep test was negative today.  The culture is pending.  Please look on your MyChart for test results.   We will notify you if the culture positive and outline a treatment plan at that time.   Please continue Tylenol and/or Ibuprofen as needed for fever, pain.  May try warm  salt water gargles, cepacol lozenges, throat spray, warm tea or water with lemon/honey, or OTC cold relief medicine for throat discomfort.   For congestion: take a daily anti-histamine like Zyrtec, Claritin, and a oral decongestant to help with post nasal drip that may be irritating your throat.   It is important to stay hydrated: drink plenty of fluids (primarily water) to keep your throat moisturized and help further relieve irritation/discomfort.     ED Prescriptions    Medication Sig Dispense Auth. Provider   fluticasone (FLONASE) 50 MCG/ACT nasal spray Place 1 spray into both nostrils daily. 16 g Hall-Potvin, [place], PA-C   levocetirizine (XYZAL) 5 MG tablet Take 1 tablet (5 mg total) by mouth every evening. 30 tablet Hall-Potvin, [place], PA-C     PDMP not reviewed this encounter.   Hall-Potvin, [place], [place] 10/13/19 [phone]

## 2019-10-13 NOTE — ED Triage Notes (Signed)
Pt c/o sore throat since yesterday 

## 2019-10-13 NOTE — Discharge Instructions (Addendum)
Please continue Tylenol and/or Ibuprofen as needed for fever, pain.  May try warm salt water gargles, cepacol lozenges, throat spray, warm tea or water with lemon/honey, or OTC cold relief medicine for throat discomfort.   For congestion: take a daily anti-histamine like Zyrtec, Claritin, and a oral decongestant to help with post nasal drip that may be irritating your throat.   It is important to stay hydrated: drink plenty of fluids (primarily water) to keep your throat moisturized and help further relieve irritation/discomfort. 

## 2019-12-12 ENCOUNTER — Other Ambulatory Visit: Payer: Self-pay

## 2019-12-12 ENCOUNTER — Ambulatory Visit
Admission: EM | Admit: 2019-12-12 | Discharge: 2019-12-12 | Disposition: A | Discharge status: Home / Self Care | Payer: No Typology Code available for payment source | Attending: Emergency Medicine | Admitting: Emergency Medicine

## 2019-12-12 ENCOUNTER — Encounter: Payer: Self-pay | Admitting: Emergency Medicine

## 2019-12-12 DIAGNOSIS — Z1152 Encounter for screening for COVID-19: Secondary | ICD-10-CM | POA: Diagnosis not present

## 2019-12-12 DIAGNOSIS — J069 Acute upper respiratory infection, unspecified: Secondary | ICD-10-CM

## 2019-12-12 NOTE — ED Triage Notes (Signed)
Pt here for sore throat and nasal congestion x 3 days 

## 2019-12-12 NOTE — Discharge Instructions (Signed)
This is most likely something viral.  Most likely, cold.  You can do over-the-counter Flonase and Zyrtec for symptoms. Warm salt water gargles to the throat may help. Follow up as needed for continued or worsening symptoms

## 2019-12-12 NOTE — ED Provider Notes (Signed)
[facility]    CSN: [id number] Arrival date & time: 12/12/19  0920      History   Chief Complaint Chief Complaint  Patient presents with  . Sore Throat  . Nasal Congestion    HPI Mark Lindsey is a 39 y.o. male.   Pt is a 39 year old male that presents with sore throat, nasal congestion x2 days.  Symptoms have been constant.  Family has been sick with similar symptoms.  They all tested negative for Covid.  He has not take anything for his symptoms.  No fever, chills, body aches or night sweats.  Has been fully vaccinated and had Covid back in March  ROS per HPI      Past Medical History:  Diagnosis Date  . Bipolar disease, chronic ([facility])   . Diabetes mellitus without complication ([facility])   . Hypertension   . Kidney stones   . Renal disorder     Patient Active Problem List   Diagnosis Date Noted  . Diabetes mellitus ([facility]) 06/02/2017  . Gastroesophageal reflux disease without esophagitis 04/30/2017  . Essential hypertension 10/10/2015  . History of asthma 10/10/2015  . History of migraine headaches 10/10/2015  . Bipolar disorder, unspecified ([facility]) 11/20/2011    History reviewed. No pertinent surgical history.     Home Medications    Prior to Admission medications   Medication Sig Start Date End Date Taking? Authorizing Provider  ALPRAZolam Prudy Feeler) 0.25 MG tablet Xanax  1 tablet daily as needed    [provider]  azelastine (ASTELIN) 0.1 % nasal spray Place 2 sprays into both nostrils 2 (two) times daily. 07/04/19   Cathie Hoops, Amy V, PA-C  fluticasone (FLONASE) 50 MCG/ACT nasal spray Place 1 spray into both nostrils daily. 10/13/19   Hall-Potvin, [place], PA-C  glimepiride (AMARYL) 2 MG tablet TAKE 1 TABLET BY MOUTH 30 MINUTES BEFORE BREAKFAST. 03/21/18   [provider]  GLIPIZIDE PO Take by mouth.    [provider]  ibuprofen (ADVIL,MOTRIN) 800 MG tablet Take 1 tablet (800 mg total) by mouth every 8 (eight) hours as needed  for moderate pain. 06/19/17   Sudie Grumbling, NP  LamoTRIgine (LAMICTAL PO) Take by mouth.    [provider]  levocetirizine (XYZAL) 5 MG tablet Take 1 tablet (5 mg total) by mouth every evening. 10/13/19   Hall-Potvin, [place], PA-C  lisinopril (PRINIVIL,ZESTRIL) 20 MG tablet Take 20 mg by mouth daily.    [provider]  metFORMIN (GLUCOPHAGE) 500 MG tablet metformin  500 mg twice daily    [provider]  METFORMIN HCL PO Take by mouth.    [provider]  Omeprazole (PRILOSEC PO) Take by mouth.     [provider]    Family History Family History  Problem Relation Age of Onset  . Diabetes Mother   . Hypertension Mother     Social History Social History   Tobacco Use  . Smoking status: Former Smoker    Types: Cigarettes  . Smokeless tobacco: Never Used  Substance Use Topics  . Alcohol use: No  . Drug use: Not Currently     Allergies   Buspirone, Quetiapine fumarate, Septra [sulfamethoxazole-trimethoprim], Seroquel [quetiapine], and Sulfa antibiotics   Review of Systems Review of Systems   Physical Exam Triage Vital Signs ED Triage Vitals [12/12/19 0953]  Enc Vitals Group     BP (!) 135/95     Pulse Rate 99     Resp 18  Temp 97.9 F (36.6 C)     Temp Source Oral     SpO2 95 %     Weight      Height      Head Circumference      Peak Flow      Pain Score 5     Pain Loc      Pain Edu?      Excl. in GC?    No data found.  Updated Vital Signs BP (!) 135/95 (BP Location: Left Arm)   Pulse 99   Temp 97.9 F (36.6 C) (Oral)   Resp 18   SpO2 95%   Visual Acuity Right Eye Distance:   Left Eye Distance:   Bilateral Distance:    Right Eye Near:   Left Eye Near:    Bilateral Near:     Physical Exam Vitals and nursing note reviewed.  Constitutional:      Appearance: Normal appearance.  HENT:     Head: Normocephalic and atraumatic.     Right Ear: Tympanic membrane and ear canal normal.     Left  Ear: Tympanic membrane and ear canal normal.     Nose: Nose normal.     Mouth/Throat:     Pharynx: Posterior oropharyngeal erythema present.  Eyes:     Conjunctiva/sclera: Conjunctivae normal.  Cardiovascular:     Rate and Rhythm: Normal rate and regular rhythm.  Pulmonary:     Effort: Pulmonary effort is normal.     Breath sounds: Normal breath sounds.  Musculoskeletal:        General: Normal range of motion.     Cervical back: Normal range of motion.  Skin:    General: Skin is warm and dry.  Neurological:     Mental Status: He is alert.  Psychiatric:        Mood and Affect: Mood normal.      UC Treatments / Results  Labs (all labs ordered are listed, but only abnormal results are displayed) Labs Reviewed  NOVEL CORONAVIRUS, NAA    EKG   Radiology No results found.  Procedures Procedures (including critical care time)  Medications Ordered in UC Medications - No data to display  Initial Impression / Assessment and Plan / UC Course  I have reviewed the triage vital signs and the nursing notes.  Pertinent labs & imaging results that were available during my care of the patient were reviewed by me and considered in my medical decision making (see chart for details).     Viral URI Doubt Covid Recommend Flonase and Zyrtec for symptoms. Warm salt water gargles for throat.  Final Clinical Impressions(s) / UC Diagnoses   Final diagnoses:  Encounter for screening for COVID-19  Viral URI     Discharge Instructions     This is most likely something viral.  Most likely, cold.  You can do over-the-counter Flonase and Zyrtec for symptoms. Warm salt water gargles to the throat may help. Follow up as needed for continued or worsening symptoms     ED Prescriptions    None     PDMP not reviewed this encounter.   Dahlia Byes A, NP 12/12/19 1053

## 2019-12-13 LAB — SARS-COV-2, NAA 2 DAY TAT

## 2019-12-13 LAB — NOVEL CORONAVIRUS, NAA: SARS-CoV-2, NAA: NOT DETECTED

## 2020-02-04 ENCOUNTER — Other Ambulatory Visit: Payer: Self-pay

## 2020-02-04 ENCOUNTER — Encounter: Payer: Self-pay | Admitting: Emergency Medicine

## 2020-02-04 ENCOUNTER — Ambulatory Visit
Admission: EM | Admit: 2020-02-04 | Discharge: 2020-02-04 | Disposition: A | Discharge status: Home / Self Care | Payer: BLUE CROSS/BLUE SHIELD | Attending: Physician Assistant | Admitting: Physician Assistant

## 2020-02-04 DIAGNOSIS — R1084 Generalized abdominal pain: Secondary | ICD-10-CM

## 2020-02-04 DIAGNOSIS — R197 Diarrhea, unspecified: Secondary | ICD-10-CM | POA: Diagnosis not present

## 2020-02-04 DIAGNOSIS — R112 Nausea with vomiting, unspecified: Secondary | ICD-10-CM

## 2020-02-04 LAB — CBC WITH DIFFERENTIAL/PLATELET
Basophils Absolute: 0.1 10*3/uL (ref 0.0–0.2)
Basos: 1 %
EOS (ABSOLUTE): 0.2 10*3/uL (ref 0.0–0.4)
Eos: 3 %
Hematocrit: 48 % (ref 37.5–51.0)
Hemoglobin: 16.3 g/dL (ref 13.0–17.7)
Immature Grans (Abs): 0.1 10*3/uL (ref 0.0–0.1)
Immature Granulocytes: 1 %
Lymphocytes Absolute: 1.9 10*3/uL (ref 0.7–3.1)
Lymphs: 33 %
MCH: 28.6 pg (ref 26.6–33.0)
MCHC: 34 g/dL (ref 31.5–35.7)
MCV: 84 fL (ref 79–97)
Monocytes Absolute: 0.3 10*3/uL (ref 0.1–0.9)
Monocytes: 6 %
Neutrophils Absolute: 3.2 10*3/uL (ref 1.4–7.0)
Neutrophils: 56 %
Platelets: 154 10*3/uL (ref 150–450)
RBC: 5.69 x10E6/uL (ref 4.14–5.80)
RDW: 13.3 % (ref 11.6–15.4)
WBC: 5.6 10*3/uL (ref 3.4–10.8)

## 2020-02-04 LAB — BASIC METABOLIC PANEL
BUN/Creatinine Ratio: 19 (ref 9–20)
BUN: 14 mg/dL (ref 6–20)
CO2: 18 mmol/L — ABNORMAL LOW (ref 20–29)
Calcium: 9.1 mg/dL (ref 8.7–10.2)
Chloride: 101 mmol/L (ref 96–106)
Creatinine, Ser: 0.74 mg/dL — ABNORMAL LOW (ref 0.76–1.27)
GFR calc Af Amer: 134 mL/min/{1.73_m2} (ref >59)
GFR calc non Af Amer: 116 mL/min/{1.73_m2} (ref >59)
Glucose: 335 mg/dL — ABNORMAL HIGH (ref 65–99)
Potassium: 4.2 mmol/L (ref 3.5–5.2)
Sodium: 139 mmol/L (ref 134–144)

## 2020-02-04 LAB — POCT URINALYSIS DIP (MANUAL ENTRY)
Bilirubin, UA: NEGATIVE
Blood, UA: NEGATIVE
Glucose, UA: 500 mg/dL — AB
Leukocytes, UA: NEGATIVE
Nitrite, UA: NEGATIVE
Protein Ur, POC: NEGATIVE mg/dL
Spec Grav, UA: 1.025 (ref 1.010–1.025)
Urobilinogen, UA: 0.2 E.U./dL
pH, UA: 5.5 (ref 5.0–8.0)

## 2020-02-04 LAB — POCT FASTING CBG KUC MANUAL ENTRY: POCT Glucose (KUC): 361 mg/dL — AB (ref 70–99)

## 2020-02-04 MED ORDER — ONDANSETRON 4 MG PO TBDP: 4.0000 mg | ORAL_TABLET | Freq: Three times a day (TID) | ORAL | 0 refills | Status: DC | PRN

## 2020-02-04 NOTE — ED Triage Notes (Signed)
Abdominal pain, nausea, vomiting, diarrhea started yesterday. Vomited twice in last 24 hours, 3 episodes diarrhea in 24 hours.

## 2020-02-04 NOTE — ED Provider Notes (Addendum)
[facility]    CSN: [id number] Arrival date & time: 02/04/20  0809      History   Chief Complaint Chief Complaint  Patient presents with  . Abdominal Pain  . Diarrhea    HPI Mark Lindsey is a 39 y.o. male.   39 year old male with history of DM, HTN comes in for 2 day history of nausea, vomiting, diarrhea, abdominal pain. 2 episode of NBNB vomiting and 3 episodes of diarrhea. No melena, hematochezia. Intermittent abdominal cramping associated with diarrhea. Denies fever, chills, body aches. Denies URI symptoms. Denies weakness, dizziness.  Patient started on insulin since COVID pneumonia 07/2019, new normal fasting ~200. States has not been able to take any of his meds this morning, and had been drinking Gatorade to help hydrate.      Past Medical History:  Diagnosis Date  . Bipolar disease, chronic ([facility])   . Diabetes mellitus without complication ([facility])   . Hypertension   . Kidney stones   . Renal disorder     Patient Active Problem List   Diagnosis Date Noted  . Diabetes mellitus ([facility]) 06/02/2017  . Gastroesophageal reflux disease without esophagitis 04/30/2017  . Essential hypertension 10/10/2015  . History of asthma 10/10/2015  . History of migraine headaches 10/10/2015  . Bipolar disorder, unspecified ([facility]) 11/20/2011    History reviewed. No pertinent surgical history.     Home Medications    Prior to Admission medications   Medication Sig Start Date End Date Taking? Authorizing Provider  insulin aspart (NOVOLOG) 100 UNIT/ML injection Inject 40 Units into the skin 2 (two) times daily.   Yes [provider]  ALPRAZolam Prudy Feeler) 0.25 MG tablet Xanax  1 tablet daily as needed    [provider]  azelastine (ASTELIN) 0.1 % nasal spray Place 2 sprays into both nostrils 2 (two) times daily. 07/04/19   Cathie Hoops, [place] V, PA-C  fluticasone (FLONASE) 50 MCG/ACT nasal spray Place 1 spray into both nostrils daily. 10/13/19   Hall-Potvin,  [place], PA-C  glimepiride (AMARYL) 2 MG tablet TAKE 1 TABLET BY MOUTH 30 MINUTES BEFORE BREAKFAST. 03/21/18   [provider]  ibuprofen (ADVIL,MOTRIN) 800 MG tablet Take 1 tablet (800 mg total) by mouth every 8 (eight) hours as needed for moderate pain. 06/19/17   Sudie Grumbling, NP  LamoTRIgine (LAMICTAL PO) Take by mouth.    [provider]  levocetirizine (XYZAL) 5 MG tablet Take 1 tablet (5 mg total) by mouth every evening. 10/13/19   Hall-Potvin, [place], PA-C  lisinopril (PRINIVIL,ZESTRIL) 20 MG tablet Take 20 mg by mouth daily.    [provider]  metFORMIN (GLUCOPHAGE) 500 MG tablet metformin  500 mg twice daily    [provider]  METFORMIN HCL PO Take by mouth.    [provider]  Omeprazole (PRILOSEC PO) Take by mouth.     [provider]  ondansetron (ZOFRAN ODT) 4 MG disintegrating tablet Take 1 tablet (4 mg total) by mouth every 8 (eight) hours as needed for nausea or vomiting. 02/04/20   Belinda Fisher, PA-C  GLIPIZIDE PO Take by mouth.  02/04/20  [provider]    Family History Family History  Problem Relation Age of Onset  . Diabetes Mother   . Hypertension Mother     Social History Social History   Tobacco Use  . Smoking status: Former Smoker    Types: Cigarettes  . Smokeless tobacco: Never Used  Substance Use Topics  . Alcohol  use: No  . Drug use: Not Currently     Allergies   Buspirone, Quetiapine fumarate, Septra [sulfamethoxazole-trimethoprim], Seroquel [quetiapine], and Sulfa antibiotics   Review of Systems Review of Systems  Reason unable to perform ROS: See HPI as above.     Physical Exam Triage Vital Signs ED Triage Vitals [02/04/20 0818]  Enc Vitals Group     BP      Pulse      Resp      Temp      Temp src      SpO2      Weight      Height      Head Circumference      Peak Flow      Pain Score 6     Pain Loc      Pain Edu?      Excl. in GC?    No data  found.  Updated Vital Signs BP (!) 145/94   Pulse (!) 101   Temp 98.3 F (36.8 C) (Oral)   Resp 16   SpO2 95%   Physical Exam Constitutional:      General: He is not in acute distress.    Appearance: Normal appearance. He is not ill-appearing, toxic-appearing or diaphoretic.  HENT:     Head: Normocephalic and atraumatic.  Cardiovascular:     Rate and Rhythm: Normal rate and regular rhythm.  Pulmonary:     Effort: Pulmonary effort is normal. No respiratory distress.     Comments: LCTAB Abdominal:     General: Bowel sounds are normal.     Palpations: Abdomen is soft.     Tenderness: There is no abdominal tenderness. There is no right CVA tenderness, left CVA tenderness, guarding or rebound.  Musculoskeletal:     Cervical back: Normal range of motion and neck supple.  Skin:    General: Skin is warm and dry.  Neurological:     Mental Status: He is alert and oriented to person, place, and time.     UC Treatments / Results  Labs (all labs ordered are listed, but only abnormal results are displayed) Labs Reviewed  POCT FASTING CBG [facility] MANUAL ENTRY - Abnormal; Notable for the following components:      Result Value   POCT Glucose ([facility]) 361 (*)    All other components within normal limits  POCT URINALYSIS DIP (MANUAL ENTRY) - Abnormal; Notable for the following components:   Glucose, UA =500 (*)    Ketones, POC UA small (15) (*)    All other components within normal limits  CBC WITH DIFFERENTIAL/PLATELET  BASIC METABOLIC PANEL    EKG   Radiology No results found.  Procedures Procedures (including critical care time)  Medications Ordered in UC Medications - No data to display  Initial Impression / Assessment and Plan / UC Course  I have reviewed the triage vital signs and the nursing notes.  Pertinent labs & imaging results that were available during my care of the patient were reviewed by me and considered in my medical decision making (see chart for  details).    CBG 361.  Dipstick with 500 glucose, small ketones.  Patient afebrile, nontoxic.  Tachycardia at triage resolved during exam.  Otherwise stable vitals.  Abdomen soft, +BS, nontender to palpation.  Able to ambulate on own without difficulty.  Discussed cannot rule out DKA/HHS causing symptoms.  However, patient currently stable and appears well.  Will draw CBC/BMP for further evaluation.  Discussed low threshold  for ED visit.  Zofran for nausea/vomiting.  Push fluids.  Strict return precautions given.  Patient expresses understanding and agrees to plan.  Discharged in stable condition pending lab results.  BMP shows anion gap of 20. Given patient clinically stable, well appearing during visit. If he has since been able to tolerate fluids, and CBG decreased after medications, can continue to monitor closely at home. If symptoms not improving, or CBG remains elevated, will need ED visit. Called patient and left VM to check message on mychart. Patient viewed updated plan on mychart last night.   Final Clinical Impressions(s) / UC Diagnoses   Final diagnoses:  Nausea vomiting and diarrhea  Generalized abdominal pain    ED Prescriptions    Medication Sig Dispense Auth. Provider   ondansetron (ZOFRAN ODT) 4 MG disintegrating tablet Take 1 tablet (4 mg total) by mouth every 8 (eight) hours as needed for nausea or vomiting. 20 tablet Belinda Fisher, PA-C     PDMP not reviewed this encounter.   Belinda Fisher, PA-C 02/04/20 0911    Belinda Fisher, PA-C 02/05/20 [phone]

## 2020-02-04 NOTE — Discharge Instructions (Addendum)
Your urine had some ketones, blood sugar at 361.  As discussed, worries for need of ED evaluation, we have drawn CBC, BMP for further evaluation.  Zofran for nausea/vomiting. Keep hydrated, you urine should be clear to pale yellow in color. Bland diet, advance as tolerated. Monitor for any worsening of symptoms, nausea or vomiting not controlled by medication, worsening abdominal pain, fever, go to the emergency department for further evaluation needed.

## 2020-02-04 NOTE — ED Notes (Signed)
Glucose 361

## 2020-02-05 ENCOUNTER — Telehealth: Payer: Self-pay | Admitting: Physician Assistant

## 2020-02-05 NOTE — Telephone Encounter (Signed)
Called patient today-- symptoms have improved. CBG last night in the 100's. Able to tolerate fluid intake. Will have patient continue to monitor and to inform PCP of this episode. Return precautions given.

## 2020-03-04 ENCOUNTER — Encounter: Payer: Self-pay | Admitting: Emergency Medicine

## 2020-03-04 ENCOUNTER — Ambulatory Visit
Admission: EM | Admit: 2020-03-04 | Discharge: 2020-03-04 | Disposition: A | Discharge status: Home / Self Care | Payer: 59 | Attending: Emergency Medicine | Admitting: Emergency Medicine

## 2020-03-04 ENCOUNTER — Other Ambulatory Visit: Payer: Self-pay

## 2020-03-04 DIAGNOSIS — J069 Acute upper respiratory infection, unspecified: Secondary | ICD-10-CM | POA: Diagnosis not present

## 2020-03-04 DIAGNOSIS — Z20822 Contact with and (suspected) exposure to covid-19: Secondary | ICD-10-CM

## 2020-03-04 MED ORDER — ALBUTEROL SULFATE HFA 108 (90 BASE) MCG/ACT IN AERS: 2.0000 | INHALATION_SPRAY | Freq: Four times a day (QID) | RESPIRATORY_TRACT | 2 refills | Status: DC | PRN

## 2020-03-04 MED ORDER — AEROCHAMBER PLUS FLO-VU MEDIUM MISC: 1.0000 | Freq: Once | 0 refills | Status: AC

## 2020-03-04 MED ORDER — BENZONATATE 100 MG PO CAPS: 100.0000 mg | ORAL_CAPSULE | Freq: Three times a day (TID) | ORAL | 0 refills | Status: DC

## 2020-03-04 NOTE — Discharge Instructions (Addendum)

## 2020-03-04 NOTE — ED Provider Notes (Signed)
[facility]    CSN: [id number] Arrival date & time: 03/04/20  0865      History   Chief Complaint Chief Complaint  Patient presents with  . Sore Throat    HPI Mark Lindsey is a 39 y.o. male  Presenting for 3-day course of URI symptoms.  Patient provides history: Endorsing sore throat, dry cough, rhinorrhea.  No difficulty breathing, swelling, chest pain or fever.  States multiple family members have been sick as well.  Patient does have remote history of Covid, though requesting testing today.  Past Medical History:  Diagnosis Date  . Bipolar disease, chronic (HCC)   . Diabetes mellitus without complication (HCC)   . Hypertension   . Kidney stones   . Renal disorder     Patient Active Problem List   Diagnosis Date Noted  . Diabetes mellitus (HCC) 06/02/2017  . Gastroesophageal reflux disease without esophagitis 04/30/2017  . Essential hypertension 10/10/2015  . History of asthma 10/10/2015  . History of migraine headaches 10/10/2015  . Bipolar disorder, unspecified (HCC) 11/20/2011    History reviewed. No pertinent surgical history.     Home Medications    Prior to Admission medications   Medication Sig Start Date End Date Taking? Authorizing Provider  albuterol (VENTOLIN HFA) 108 (90 Base) MCG/ACT inhaler Inhale 2 puffs into the lungs every 6 (six) hours as needed for wheezing or shortness of breath. 03/04/20   Hall-Potvin, [place], PA-C  ALPRAZolam (XANAX) 0.25 MG tablet Xanax  1 tablet daily as needed    [provider]  azelastine (ASTELIN) 0.1 % nasal spray Place 2 sprays into both nostrils 2 (two) times daily. 07/04/19   Cathie Hoops, Amy V, PA-C  benzonatate (TESSALON) 100 MG capsule Take 1 capsule (100 mg total) by mouth every 8 (eight) hours. 03/04/20   Hall-Potvin, [place], PA-C  fluticasone (FLONASE) 50 MCG/ACT nasal spray Place 1 spray into both nostrils daily. 10/13/19   Hall-Potvin, [place], PA-C  glimepiride (AMARYL) 2 MG tablet  TAKE 1 TABLET BY MOUTH 30 MINUTES BEFORE BREAKFAST. 03/21/18   [provider]  ibuprofen (ADVIL,MOTRIN) 800 MG tablet Take 1 tablet (800 mg total) by mouth every 8 (eight) hours as needed for moderate pain. 06/19/17   Sudie Grumbling, NP  insulin aspart (NOVOLOG) 100 UNIT/ML injection Inject 40 Units into the skin 2 (two) times daily.    [provider]  LamoTRIgine (LAMICTAL PO) Take by mouth.    [provider]  levocetirizine (XYZAL) 5 MG tablet Take 1 tablet (5 mg total) by mouth every evening. 10/13/19   Hall-Potvin, [place], PA-C  lisinopril (PRINIVIL,ZESTRIL) 20 MG tablet Take 20 mg by mouth daily.    [provider]  metFORMIN (GLUCOPHAGE) 500 MG tablet metformin  500 mg twice daily    [provider]  METFORMIN HCL PO Take by mouth.    [provider]  Omeprazole (PRILOSEC PO) Take by mouth.     [provider]  ondansetron (ZOFRAN ODT) 4 MG disintegrating tablet Take 1 tablet (4 mg total) by mouth every 8 (eight) hours as needed for nausea or vomiting. 02/04/20   Belinda Fisher, PA-C  Spacer/Aero-Holding Chambers (AEROCHAMBER PLUS FLO-VU MEDIUM) MISC 1 each by Other route once for 1 dose. 03/04/20 03/04/20  Hall-Potvin, [place], PA-C  GLIPIZIDE PO Take by mouth.  02/04/20  [provider]    Family History Family History  Problem Relation Age of Onset  . Diabetes Mother   . Hypertension Mother  Social History Social History   Tobacco Use  . Smoking status: Former Smoker    Types: Cigarettes  . Smokeless tobacco: Never Used  Substance Use Topics  . Alcohol use: No  . Drug use: Not Currently     Allergies   Buspirone, Quetiapine fumarate, Septra [sulfamethoxazole-trimethoprim], Seroquel [quetiapine], and Sulfa antibiotics   Review of Systems Review of Systems  Constitutional: Negative for fatigue and fever.  HENT: Positive for congestion, postnasal drip, rhinorrhea and sore throat. Negative for  trouble swallowing and voice change.   Respiratory: Positive for cough. Negative for shortness of breath and wheezing.   Cardiovascular: Negative for chest pain and palpitations.  Gastrointestinal: Negative for abdominal pain, diarrhea and vomiting.  Musculoskeletal: Negative for arthralgias and myalgias.  Skin: Negative for rash and wound.  Neurological: Negative for speech difficulty and headaches.  All other systems reviewed and are negative.    Physical Exam Triage Vital Signs ED Triage Vitals  Enc Vitals Group     BP      Pulse      Resp      Temp      Temp src      SpO2      Weight      Height      Head Circumference      Peak Flow      Pain Score      Pain Loc      Pain Edu?      Excl. in GC?    No data found.  Updated Vital Signs BP (!) 155/112 (BP Location: Left Arm)   Pulse 100   Temp 98 F (36.7 C) (Oral)   Resp 18   SpO2 98%   Visual Acuity Right Eye Distance:   Left Eye Distance:   Bilateral Distance:    Right Eye Near:   Left Eye Near:    Bilateral Near:     Physical Exam Constitutional:      General: He is not in acute distress.    Appearance: He is well-developed. He is obese. He is not toxic-appearing or diaphoretic.  HENT:     Head: Normocephalic and atraumatic.     Right Ear: Tympanic membrane and ear canal normal.     Left Ear: Tympanic membrane and ear canal normal.     Mouth/Throat:     Mouth: Mucous membranes are moist.     Pharynx: Oropharynx is clear. Uvula midline. No uvula swelling.     Tonsils: No tonsillar exudate. 1+ on the right. 1+ on the left.     Comments: Cobblestoning present Eyes:     General: No scleral icterus.    Conjunctiva/sclera: Conjunctivae normal.     Pupils: Pupils are equal, round, and reactive to light.  Neck:     Comments: Trachea midline, negative JVD Cardiovascular:     Rate and Rhythm: Normal rate and regular rhythm.  Pulmonary:     Effort: Pulmonary effort is normal. No respiratory distress.      Breath sounds: No wheezing.  Musculoskeletal:     Cervical back: Neck supple. No tenderness.  Lymphadenopathy:     Cervical: No cervical adenopathy.  Skin:    Capillary Refill: Capillary refill takes less than 2 seconds.     Coloration: Skin is not jaundiced or pale.     Findings: No rash.  Neurological:     Mental Status: He is alert and oriented to person, place, and time.      UC Treatments /  Results  Labs (all labs ordered are listed, but only abnormal results are displayed) Labs Reviewed  NOVEL CORONAVIRUS, NAA    EKG   Radiology No results found.  Procedures Procedures (including critical care time)  Medications Ordered in UC Medications - No data to display  Initial Impression / Assessment and Plan / UC Course  I have reviewed the triage vital signs and the nursing notes.  Pertinent labs & imaging results that were available during my care of the patient were reviewed by me and considered in my medical decision making (see chart for details).     Patient afebrile, nontoxic, with SpO2 98%.  Likely viral versus seasonal.  Covid PCR pending.  Patient to quarantine until results are back.  We will treat supportively as outlined below.  Return precautions discussed, patient verbalized understanding and is agreeable to plan. Final Clinical Impressions(s) / UC Diagnoses   Final diagnoses:  Encounter for screening laboratory testing for COVID-19 virus  URI with cough and congestion     Discharge Instructions     Tessalon for cough. Start flonase, atrovent nasal spray for nasal congestion/drainage. You can use over the counter nasal saline rinse such as neti pot for nasal congestion. Keep hydrated, your urine should be clear to pale yellow in color. Tylenol/motrin for fever and pain. Monitor for any worsening of symptoms, chest pain, shortness of breath, wheezing, swelling of the throat, go to the emergency department for further evaluation needed.     ED  Prescriptions    Medication Sig Dispense Auth. Provider   albuterol (VENTOLIN HFA) 108 (90 Base) MCG/ACT inhaler Inhale 2 puffs into the lungs every 6 (six) hours as needed for wheezing or shortness of breath. 8 g Hall-Potvin, [place], PA-C   Spacer/Aero-Holding Chambers (AEROCHAMBER PLUS FLO-VU MEDIUM) MISC 1 each by Other route once for 1 dose. 1 each Hall-Potvin, [place], PA-C   benzonatate (TESSALON) 100 MG capsule Take 1 capsule (100 mg total) by mouth every 8 (eight) hours. 21 capsule Hall-Potvin, [place], PA-C     PDMP not reviewed this encounter.   Hall-Potvin, [place], [place] 03/04/20 1109

## 2020-03-04 NOTE — ED Triage Notes (Signed)
Pt here for sore throat, cough and nasal congestion x 3 days; pt sts home covid test was negative yesteday

## 2020-03-05 LAB — NOVEL CORONAVIRUS, NAA: SARS-CoV-2, NAA: NOT DETECTED

## 2020-03-05 LAB — SARS-COV-2, NAA 2 DAY TAT

## 2020-04-10 ENCOUNTER — Ambulatory Visit
Admission: EM | Admit: 2020-04-10 | Discharge: 2020-04-10 | Disposition: A | Discharge status: Home / Self Care | Payer: 59 | Attending: Emergency Medicine | Admitting: Emergency Medicine

## 2020-04-10 ENCOUNTER — Other Ambulatory Visit: Payer: Self-pay

## 2020-04-10 ENCOUNTER — Ambulatory Visit (INDEPENDENT_AMBULATORY_CARE_PROVIDER_SITE_OTHER): Payer: 59

## 2020-04-10 DIAGNOSIS — S4991XA Unspecified injury of right shoulder and upper arm, initial encounter: Secondary | ICD-10-CM

## 2020-04-10 DIAGNOSIS — M25511 Pain in right shoulder: Secondary | ICD-10-CM

## 2020-04-10 MED ORDER — IBUPROFEN 800 MG PO TABS: 800.0000 mg | ORAL_TABLET | Freq: Three times a day (TID) | ORAL | 0 refills | Status: DC

## 2020-04-10 NOTE — ED Triage Notes (Signed)
Pt states a box of hamburgers were falling off a top shelf at work and used his rt hand to catch it and felt a pop in rt shoulder.

## 2020-04-10 NOTE — Discharge Instructions (Addendum)
No normal Use anti-inflammatories for pain/swelling. You may take up to 800 mg Ibuprofen every 8 hours with food. You may supplement Ibuprofen with Tylenol [phone] mg every 8 hours.  Wear sling only for comfort, please remove sling and perform shoulder range of motion exercises 2-3 times daily May apply ice Follow-up with sports medicine for further imaging and treatment if not regaining full range of motion or having persistent pain

## 2020-04-11 NOTE — ED Provider Notes (Signed)
[facility]    CSN: [id number] Arrival date & time: 04/10/20  1739      History   Chief Complaint Chief Complaint  Patient presents with   Shoulder Injury    HPI Mark Lindsey is a 39 y.o. male history of hypertension presenting today for evaluation of right shoulder injury.  Patient was at work today, attempted to catch a falling box approximately 50 pounds and this caused a jolting sensation in his shoulder and felt a popping sensation.  Since he has had pain with any movement.  He denies prior history of shoulder injuries.  Denies any numbness or tingling.  HPI  Past Medical History:  Diagnosis Date   Bipolar disease, chronic (HCC)    Diabetes mellitus without complication (HCC)    Hypertension    Kidney stones    Renal disorder     Patient Active Problem List   Diagnosis Date Noted   Diabetes mellitus (HCC) 06/02/2017   Gastroesophageal reflux disease without esophagitis 04/30/2017   Essential hypertension 10/10/2015   History of asthma 10/10/2015   History of migraine headaches 10/10/2015   Bipolar disorder, unspecified (HCC) 11/20/2011    History reviewed. No pertinent surgical history.     Home Medications    Prior to Admission medications   Medication Sig Start Date End Date Taking? Authorizing Provider  albuterol (VENTOLIN HFA) 108 (90 Base) MCG/ACT inhaler Inhale 2 puffs into the lungs every 6 (six) hours as needed for wheezing or shortness of breath. 03/04/20   Hall-Potvin, [place], PA-C  ALPRAZolam (XANAX) 0.25 MG tablet Xanax  1 tablet daily as needed    [provider]  azelastine (ASTELIN) 0.1 % nasal spray Place 2 sprays into both nostrils 2 (two) times daily. 07/04/19   Yu, Amy V, PA-C  glimepiride (AMARYL) 2 MG tablet TAKE 1 TABLET BY MOUTH 30 MINUTES BEFORE BREAKFAST. 03/21/18   [provider]  ibuprofen (ADVIL) 800 MG tablet Take 1 tablet (800 mg total) by mouth 3 (three) times daily. 04/10/20    Sherina Stammer C, PA-C  insulin aspart (NOVOLOG) 100 UNIT/ML injection Inject 40 Units into the skin 2 (two) times daily.    [provider]  LamoTRIgine (LAMICTAL PO) Take by mouth.    [provider]  levocetirizine (XYZAL) 5 MG tablet Take 1 tablet (5 mg total) by mouth every evening. 10/13/19   Hall-Potvin, [place], PA-C  lisinopril (PRINIVIL,ZESTRIL) 20 MG tablet Take 20 mg by mouth daily.    [provider]  metFORMIN (GLUCOPHAGE) 500 MG tablet metformin  500 mg twice daily    [provider]  METFORMIN HCL PO Take by mouth.    [provider]  Omeprazole (PRILOSEC PO) Take by mouth.     [provider]  ondansetron (ZOFRAN ODT) 4 MG disintegrating tablet Take 1 tablet (4 mg total) by mouth every 8 (eight) hours as needed for nausea or vomiting. 02/04/20   Belinda Fisher, PA-C  GLIPIZIDE PO Take by mouth.  02/04/20  [provider]    Family History Family History  Problem Relation Age of Onset   Diabetes Mother    Hypertension Mother     Social History Social History   Tobacco Use   Smoking status: Former Smoker    Types: Cigarettes   Smokeless tobacco: Never Used  Substance Use Topics   Alcohol use: No   Drug use: Not Currently     Allergies   Buspirone, Quetiapine fumarate, Septra [sulfamethoxazole-trimethoprim], Seroquel [  quetiapine], and Sulfa antibiotics   Review of Systems Review of Systems  Constitutional: Negative for fatigue and fever.  Eyes: Negative for redness, itching and visual disturbance.  Respiratory: Negative for shortness of breath.   Cardiovascular: Negative for chest pain and leg swelling.  Gastrointestinal: Negative for nausea and vomiting.  Musculoskeletal: Positive for arthralgias and myalgias.  Skin: Negative for color change, rash and wound.  Neurological: Negative for dizziness, syncope, weakness, light-headedness and headaches.     Physical Exam Triage Vital Signs ED  Triage Vitals  Enc Vitals Group     BP 04/10/20 1858 (!) 141/91     Pulse Rate 04/10/20 1858 74     Resp 04/10/20 1858 16     Temp 04/10/20 1858 98 F (36.7 C)     Temp Source 04/10/20 1858 Oral     SpO2 04/10/20 1858 98 %     Weight --      Height --      Head Circumference --      Peak Flow --      Pain Score 04/10/20 1913 7     Pain Loc --      Pain Edu? --      Excl. in GC? --    No data found.  Updated Vital Signs BP (!) 141/91 (BP Location: Left Arm)    Pulse 74    Temp 98 F (36.7 C) (Oral)    Resp 16    SpO2 98%   Visual Acuity Right Eye Distance:   Left Eye Distance:   Bilateral Distance:    Right Eye Near:   Left Eye Near:    Bilateral Near:     Physical Exam Vitals and nursing note reviewed.  Constitutional:      Appearance: He is well-developed and well-nourished.     Comments: No acute distress  HENT:     Head: Normocephalic and atraumatic.     Nose: Nose normal.  Eyes:     Conjunctiva/sclera: Conjunctivae normal.  Cardiovascular:     Rate and Rhythm: Normal rate.  Pulmonary:     Effort: Pulmonary effort is normal. No respiratory distress.  Abdominal:     General: There is no distension.  Musculoskeletal:        General: Normal range of motion.     Cervical back: Neck supple.     Comments: Nontender to palpation along clavicle AC joint or scapular spine, tenderness to palpation to periscapular musculature and into proximal humerus, tender to palpation to anterior chest near bicep tendon  Active range of motion to approximately 90 degrees, full passive range of motion, negative resisted external rotation, slightly limited lift off, grip strength 5/5 and equal bilaterally, radial pulse 2+  Skin:    General: Skin is warm and dry.  Neurological:     Mental Status: He is alert and oriented to person, place, and time.  Psychiatric:        Mood and Affect: Mood and affect normal.      UC Treatments / Results  Labs (all labs ordered are listed,  but only abnormal results are displayed) Labs Reviewed - No data to display  EKG   Radiology DG Shoulder Right  Result Date: 04/10/2020 CLINICAL DATA:  Right shoulder pain. EXAM: RIGHT SHOULDER - 2+ VIEW COMPARISON:  None. FINDINGS: There is no evidence of fracture or dislocation. There is no evidence of arthropathy or other focal bone abnormality. Soft tissues are unremarkable. IMPRESSION: Negative. Electronically Signed   By:  Aram Candela M.D.   On: 04/10/2020 19:37    Procedures Procedures (including critical care time)  Medications Ordered in UC Medications - No data to display  Initial Impression / Assessment and Plan / UC Course  I have reviewed the triage vital signs and the nursing notes.  Pertinent labs & imaging results that were available during my care of the patient were reviewed by me and considered in my medical decision making (see chart for details).     X-ray negative for acute bony abnormality, possible rotator cuff tendinopathy with reported mechanism of injury, strength remains intact, lower suspicion of complete tear, but will provide sling for comfort recommend anti-inflammatories ice and gentle range of motion exercises.  Follow-up with sports medicine if not returning to full range of motion/use of shoulder.  Discussed strict return precautions. Patient verbalized understanding and is agreeable with plan.  Final Clinical Impressions(s) / UC Diagnoses   Final diagnoses:  Injury of right shoulder, initial encounter     Discharge Instructions     No normal Use anti-inflammatories for pain/swelling. You may take up to 800 mg Ibuprofen every 8 hours with food. You may supplement Ibuprofen with Tylenol [phone] mg every 8 hours.  Wear sling only for comfort, please remove sling and perform shoulder range of motion exercises 2-3 times daily May apply ice Follow-up with sports medicine for further imaging and treatment if not regaining full range of  motion or having persistent pain    ED Prescriptions    Medication Sig Dispense Auth. Provider   ibuprofen (ADVIL) 800 MG tablet Take 1 tablet (800 mg total) by mouth 3 (three) times daily. 21 tablet Celsey Asselin, [place] C, PA-C     PDMP not reviewed this encounter.   Lew Dawes, PA-C 04/11/20 1015

## 2020-04-12 ENCOUNTER — Ambulatory Visit (INDEPENDENT_AMBULATORY_CARE_PROVIDER_SITE_OTHER): Payer: 59 | Admitting: Family Medicine

## 2020-04-12 ENCOUNTER — Other Ambulatory Visit: Payer: Self-pay

## 2020-04-12 VITALS — BP 145/92 | Ht 72.0 in | Wt 275.0 lb

## 2020-04-12 DIAGNOSIS — S43001D Unspecified subluxation of right shoulder joint, subsequent encounter: Secondary | ICD-10-CM | POA: Insufficient documentation

## 2020-04-12 DIAGNOSIS — S43001A Unspecified subluxation of right shoulder joint, initial encounter: Secondary | ICD-10-CM

## 2020-04-12 MED ORDER — HYDROCODONE-ACETAMINOPHEN 5-325 MG PO TABS: 1.0000 | ORAL_TABLET | Freq: Three times a day (TID) | ORAL | 0 refills | Status: DC | PRN

## 2020-04-12 NOTE — Patient Instructions (Signed)
Nice to meet you Please try ice  Please stay in the sling for one week.  You can try the range of motion after the week of the sling   Please send me a message in MyChart with any questions or updates.  Please see me back in 2 weeks.   --Dr. Jordan Likes

## 2020-04-12 NOTE — Progress Notes (Signed)
  Mark Lindsey - 39 y.o. male MRN [id number]  Date of birth: 1981-03-05  SUBJECTIVE:  Including CC & ROS.  No chief complaint on file.   Mark Lindsey is a 39 y.o. male that is presenting with right shoulder pain after an injury sustained at work.  He was getting a box down off a shelf and it fell and his shoulder was bent backwards.  Since that time he had significant pain.  No prior history of problems with the right shoulder.  Has pain posteriorly.  Has been in a sling since being seen in urgent care..  Independent review of the right shoulder x-ray from 12/21 shows no acute abnormalities.   Review of Systems See HPI   HISTORY: Past Medical, Surgical, Social, and Family History Reviewed & Updated per EMR.   Pertinent Historical Findings include:  Past Medical History:  Diagnosis Date  . Bipolar disease, chronic (HCC)   . Diabetes mellitus without complication (HCC)   . Hypertension   . Kidney stones   . Renal disorder     No past surgical history on file.  Family History  Problem Relation Age of Onset  . Diabetes Mother   . Hypertension Mother     Social History   Socioeconomic History  . Marital status: Divorced    Spouse name: Not on file  . Number of children: Not on file  . Years of education: Not on file  . Highest education level: Not on file  Occupational History  . Not on file  Tobacco Use  . Smoking status: Former Smoker    Types: Cigarettes  . Smokeless tobacco: Never Used  Substance and Sexual Activity  . Alcohol use: No  . Drug use: Not Currently  . Sexual activity: Not on file  Other Topics Concern  . Not on file  Social History Narrative  . Not on file   Social Determinants of Health   Financial Resource Strain: Not on file  Food Insecurity: Not on file  Transportation Needs: Not on file  Physical Activity: Not on file  Stress: Not on file  Social Connections: Not on file  Intimate Partner Violence: Not on file     PHYSICAL  EXAM:  VS: BP (!) 145/92   Ht 6' (1.829 m)   Wt 275 lb (124.7 kg)   BMI 37.30 kg/m  Physical Exam Gen: NAD, alert, cooperative with exam, well-appearing MSK:  Right shoulder: Normal passive abduction and flexion. Normal grip strength. Neurovascular intact   ASSESSMENT & PLAN:   Shoulder subluxation, right, initial encounter Injury occurred on 12/21 while at work.  Likely more for subluxation and less likely for rotator cuff. -Counseled on sling use. -Counseled on home exercise therapy and supportive care. -[place]. -Provided work note. -Follow-up in 2 weeks.

## 2020-04-12 NOTE — Assessment & Plan Note (Signed)
Injury occurred on 12/21 while at work.  Likely more for subluxation and less likely for rotator cuff. -Counseled on sling use. -Counseled on home exercise therapy and supportive care. -[place]. -Provided work note. -Follow-up in 2 weeks.

## 2020-04-17 ENCOUNTER — Encounter: Payer: Self-pay | Admitting: Family Medicine

## 2020-04-26 ENCOUNTER — Ambulatory Visit: Payer: Self-pay

## 2020-04-26 ENCOUNTER — Other Ambulatory Visit: Payer: Self-pay

## 2020-04-26 ENCOUNTER — Ambulatory Visit (INDEPENDENT_AMBULATORY_CARE_PROVIDER_SITE_OTHER): Payer: 59 | Admitting: Family Medicine

## 2020-04-26 DIAGNOSIS — S43001D Unspecified subluxation of right shoulder joint, subsequent encounter: Secondary | ICD-10-CM

## 2020-04-26 NOTE — Progress Notes (Signed)
  Mark Lindsey - 40 y.o. male MRN [id number]  Date of birth: 10-06-1980  SUBJECTIVE:  Including CC & ROS.  No chief complaint on file.   Mark Lindsey is a 40 y.o. male that is following up for his right shoulder pain.  His pain is improved and his range of motion has been improving.  He does notice intermittent pain that radiates down to his elbow..    Review of Systems See HPI   HISTORY: Past Medical, Surgical, Social, and Family History Reviewed & Updated per EMR.   Pertinent Historical Findings include:  Past Medical History:  Diagnosis Date  . Bipolar disease, chronic (HCC)   . Diabetes mellitus without complication (HCC)   . Hypertension   . Kidney stones   . Renal disorder     No past surgical history on file.  Family History  Problem Relation Age of Onset  . Diabetes Mother   . Hypertension Mother     Social History   Socioeconomic History  . Marital status: Divorced    Spouse name: Not on file  . Number of children: Not on file  . Years of education: Not on file  . Highest education level: Not on file  Occupational History  . Not on file  Tobacco Use  . Smoking status: Former Smoker    Types: Cigarettes  . Smokeless tobacco: Never Used  Substance and Sexual Activity  . Alcohol use: No  . Drug use: Not Currently  . Sexual activity: Not on file  Other Topics Concern  . Not on file  Social History Narrative  . Not on file   Social Determinants of Health   Financial Resource Strain: Not on file  Food Insecurity: Not on file  Transportation Needs: Not on file  Physical Activity: Not on file  Stress: Not on file  Social Connections: Not on file  Intimate Partner Violence: Not on file     PHYSICAL EXAM:  VS: BP (!) 142/96   Ht 6' (1.829 m)   Wt 270 lb (122.5 kg)   BMI 36.62 kg/m  Physical Exam Gen: NAD, alert, cooperative with exam, well-appearing MSK:  Right shoulder: Some limitation with abduction and external rotation. Near  normal flexion. Normal strength resistance. Neurovascularly intact  Limited ultrasound: Right shoulder:  Normal-appearing biceps tendon and short long axis. Normal-appearing subscapularis and static and dynamic testing. Normal-appearing supraspinatus with mild overlying bursitis. No change in the posterior glenohumeral joint.  Summary: Mild subacromial bursitis but no significant structural changes.  Ultrasound and interpretation by Clare Gandy, MD    ASSESSMENT & PLAN:   Shoulder subluxation, right, subsequent encounter Injury occurred on 12/21 while at work.  Having improvement in function and pain. -Counseled on home exercise therapy and supportive care. -Referral to physical therapy. -Follow-up in 4 weeks.

## 2020-04-26 NOTE — Patient Instructions (Signed)
Good to see you Please use ice as needed  Please continue the exercises  Physical therapy will give you a call   Please send me a message in MyChart with any questions or updates.  Please see me back in 4 weeks.   --Dr. Jordan Likes

## 2020-04-26 NOTE — Assessment & Plan Note (Addendum)
Injury occurred on 12/21 while at work.  Having improvement in function and pain. -Counseled on home exercise therapy and supportive care. -Referral to physical therapy. -Follow-up in 4 weeks.

## 2020-04-30 ENCOUNTER — Other Ambulatory Visit: Payer: Self-pay

## 2020-04-30 ENCOUNTER — Ambulatory Visit: Payer: Worker's Compensation | Attending: Family Medicine

## 2020-04-30 ENCOUNTER — Telehealth: Payer: Self-pay | Admitting: Family Medicine

## 2020-04-30 DIAGNOSIS — M6281 Muscle weakness (generalized): Secondary | ICD-10-CM | POA: Insufficient documentation

## 2020-04-30 DIAGNOSIS — M25511 Pain in right shoulder: Secondary | ICD-10-CM | POA: Insufficient documentation

## 2020-04-30 DIAGNOSIS — S43001D Unspecified subluxation of right shoulder joint, subsequent encounter: Secondary | ICD-10-CM | POA: Diagnosis present

## 2020-04-30 DIAGNOSIS — M25611 Stiffness of right shoulder, not elsewhere classified: Secondary | ICD-10-CM | POA: Diagnosis present

## 2020-04-30 NOTE — Telephone Encounter (Signed)
Per pt,provider advised him to remain out of work --Pt states forgot to get letter/ note from provider stating such.  --Forwarding message to Dr.Schmit for review & written notice (Out of Work)  --International aid/development worker

## 2020-04-30 NOTE — Telephone Encounter (Signed)
Provided letter in [place].   Myra Rude, MD [facility] 04/30/2020, 1:26 PM

## 2020-04-30 NOTE — Therapy (Addendum)
[facility] [address] [place], [place], [address] Phone: [phone]   Fax:  [phone]  Physical Therapy Evaluation  Patient Details  Name: Mark Lindsey MRN: [id number] Date of Birth: 05/30/1980 Referring Provider (PT): Lenn Cal Date: 04/30/2020   PT End of Session - 04/30/20 1101    Visit Number 1    Number of Visits 17    Date for PT Re-Evaluation 06/25/20    PT Start Time 0928    PT Stop Time 1013    PT Time Calculation (min) 45 min    Activity Tolerance Patient tolerated treatment well;Patient limited by pain    Behavior During Therapy [place] for tasks assessed/performed           Past Medical History:  Diagnosis Date  . Bipolar disease, chronic (HCC)   . Diabetes mellitus without complication (HCC)   . Hypertension   . Kidney stones   . Renal disorder     History reviewed. No pertinent surgical history.  There were no vitals filed for this visit.    Subjective Assessment - 04/30/20 0929    Subjective Pt injured shoulder at work on 04/10/2020, where a box was falling and he held his arm up to attempt to stop it at which time he felt a pop and pain in his Right shoulder. Since injury, patient has been unable to work, unable to participate in work duties. Mark Lindsey was seen by the Sports Medicine specialist in [place] and received dx of R shoulder subluxation. He also reports that at this time on 04/26/2020, an ultrasound of his RIGHT shoulder was done and he reports he was told there is fluid in the front of his shoulder and stress at the labrum.    Pertinent History Hx of L collar bone fx and shoulder dislocation when about 40 years old; left wrist fracture when younger, ankle sprains, low back pain. Pt reports he was hospitalized for COVID-19 in April of 2021 where he was admitted for 5 days and provided supplemental O2.    Limitations House hold activities;Other (comment)   Difficulty with dressing,  Unable to reach into back pocket, unable to reach overhead or out to the side, Arms weakness, hand weakness. Increased achiness/stiffness with prolonged positions.   Diagnostic tests Musculoskeletal ultrasound on 04/26/2020    Patient Stated Goals To get stronger, to be able to lift and reach overhead, to be able to use right arm normally for home and work activities.    Currently in Pain? Yes    Pain Score 6     Pain Location Shoulder    Pain Orientation Right;Posterior;Upper    Pain Descriptors / Indicators Aching;Tightness   Stabbing pain with movement.   Pain Type Chronic pain    Pain Radiating Towards posterior elbow, occasional    Pain Onset Other (comment)   04/10/2020   Pain Frequency Constant    Aggravating Factors  Attempting to reach up overhead, moving arm up, moving arm out (into external rotation)    Pain Relieving Factors Heat, Ice    Effect of Pain on Daily Activities Unable to perform daily ADLs in the home, Unable to work (no option for light duty), Unable to play and limited caretaking for younger children (39, 19, 12, 4 year old)    Multiple Pain Sites No              [facility] PT Assessment - 04/30/20 2633  Assessment   Medical Diagnosis Right shoulder subluxation    Referring Provider (PT) Jordan Likes    Onset Date/Surgical Date 04/10/20    Hand Dominance Right    Prior Therapy PT for low back pain in early 20's      Precautions   Precaution Comments None identified      Balance Screen   Has the patient fallen in the past 6 months No    Has the patient had a decrease in activity level because of a fear of falling?  No    Is the patient reluctant to leave their home because of a fear of falling?  No      Home Environment   Living Environment Private residence    Living Arrangements Spouse/significant other;Children   4 children aged 40, 26, 61 and 45   Type of Home House    Home Equipment None      Prior Function   Level of Independence --   Modified  Independence using non-dominant arm   Vocation --   Previously working Full Time in [employer] Requirements Reaching forward/overhead, lifting and carrying weighted objects    Leisure Enjoys wrestling/playing with his younger children.      Cognition   Overall Cognitive Status Within Functional Limits for tasks assessed      Observation/Other Assessments   Observations RUE guarded and held closely to the body throughout.    S[facility]      Posture/Postural Control   Posture Comments Forward head posture, bilateral shoulder protraction and scapular abduction, mild scapular winging and anterior tipping R>L. Head tilt left.      ROM / Strength   AROM / PROM / Strength AROM;PROM;Strength      AROM   Overall AROM  --   Right side grossly limited copared to intact Left UE   AROM Assessment Site Shoulder;Elbow;Forearm;Wrist    Right/Left Shoulder Right    Right Shoulder Extension 10 Degrees    Right Shoulder Flexion 75 Degrees   limited by feeling of tightness, pain   Right Shoulder ABduction 85 Degrees   limited by pain, popping sensation   Right Shoulder External Rotation 30 Degrees   anterior shoulder tightness and pinching sensation at posterior GH joint at end of available range reported, painful   Left Shoulder Flexion --   Full and WFL   Left Shoulder ABduction --   Full and WFL   Left Shoulder Internal Rotation --   Full and WFL   Left Shoulder External Rotation --   Full and WFL     PROM   Overall PROM Comments WFL throughout LUE and Right Elbow, limited for RIGHT shoulder    PROM Assessment Site Shoulder    Right/Left Shoulder Right    Right Shoulder Extension 5 Degrees    Right Shoulder Flexion 80 Degrees   + pain at end of available range   Right Shoulder ABduction 90 Degrees   + pain, popping sensation at end of available range   Right Shoulder External Rotation 40 Degrees   + pain at end range     Strength   Overall Strength Comments LUE grossly  4+/5    Strength Assessment Site Shoulder;Elbow;Forearm;Hand    Right/Left Shoulder Right    Right Shoulder Flexion 3-/5    Right Shoulder Extension 3-/5    Right Shoulder ABduction 3-/5    Right Shoulder Internal Rotation 3+/5    Right Shoulder External Rotation 3-/5  Right/Left Elbow Right    Right Elbow Flexion 4-/5    Right Elbow Extension 4-/5    Right/Left Forearm Right    Right Forearm Pronation 3-/5    Right Forearm Supination 3-/5    Right/Left hand Right;Left    Right Hand Grip (lbs) 16    Left Hand Grip (lbs) 40      Palpation   Palpation comment RIGHT shoulder: TTP at the superior GH joint line, lateral scapular border, long head of biceps tendon, pec minor. Bilateral Upper Trap/levator scap tightness.                      Objective measurements completed on examination: See above findings.               PT Education - 04/30/20 1015    Education Details Access Code: [email or web address]   URL: [email or web address]   Date: 04/30/2020  Prepared by: Claude MangesMonica Kazimir Hartnett    Exercises:   Seated Scapular Retraction - 1 x daily - 7 x weekly - 3 sets - 10 reps   Seated Gripping Towel - 1 x daily - 7 x weekly - 3 sets - 10 reps   Standing Shoulder External Rotation Stretch in Doorway - 1 x daily - 7 x weekly - 3 sets - 10 reps    Person(s) Educated Patient    Methods Explanation;Demonstration;Verbal cues;Handout    Comprehension Verbalized understanding;Returned demonstration            PT Short Term Goals - 04/30/20 1115      PT SHORT TERM GOAL #1   Title Pt will demonstrate independence with initial HEP    Time 2    Period Weeks    Status New    Target Date 05/18/20      PT SHORT TERM GOAL #2   Title Pt will demo at least 120 degrees of passive right shoulder abduction and flexion.    Time 2    Period Weeks    Status New    Target Date 05/18/20             PT Long Term Goals - 04/30/20 1119      PT LONG TERM GOAL #1   Title  Pt will demonstrate gross RUE strength equivalent to LUE.    Baseline LUE grossly 4/5. RUE grossly 3-/5.    Time 8    Period Weeks    Status New    Target Date 06/25/20      PT LONG TERM GOAL #2   Title Pt will attain right shoulder flexion/abduction to at least 160 degrees with </= 1/10 pain to facilitate ability to reach overhead .    Baseline Shoulder flexion AROM: R = 75degrees, L = 170    Time 8    Period Weeks    Status New    Target Date 06/25/20      PT LONG TERM GOAL #3   Title Pt will report able to return to work with </= 1/10 pain in right shoulder    Baseline Unable to work    Time 8    Period Weeks    Status New    Target Date 06/25/20      PT LONG TERM GOAL #4   Title Pt will achieve grip strength on the right within at least 5 lbs of the uninvolved upper extremity.    Baseline Right= 16, Left = 40    Time 8    Period  Weeks    Status New    Target Date 06/25/20                  Plan - 04/30/20 1104    Clinical Impression Statement Pt is a kind 40 year old male who presents to physical therapy s/p RIGHT shoulder subluxation which occurred at work on 04/10/2020. Pt presents with right shoulder ROM grossly limited by tightness, pain, and muscle weakness. He presents with gross muscle weakness of the right UE compared to uninvolved LUE, with a grip strength deficit of 24lbs on his dominant right hand. Due to these impairments, Mark Lindsey is limited in his ability to complete ADLs, housework, and is unable return to work and complete work duties which include reaching and lifting. Pt would benefit from skilled physical therapy services to decrease pain and improve ROM, strength, and functional mobility to promote functional independence and return to PLOF.    Personal Factors and Comorbidities Fitness;Comorbidity 2    Comorbidities type 2 diabetes, hypertension    Examination-Activity Limitations Bathing;Caring for Others;Carry;Dressing;Hygiene/Grooming;Lift;Reach  Overhead    Examination-Participation Restrictions Occupation    Stability/Clinical Decision Making Evolving/Moderate complexity    Clinical Decision Making Moderate    Rehab Potential Good    PT Frequency 2x / week    PT Duration 8 weeks    PT Treatment/Interventions ADLs/Self Care Home Management;Cryotherapy;Electrical Stimulation;Moist Heat;Functional mobility training;Therapeutic activities;Therapeutic exercise;Neuromuscular re-education;Patient/family education;Manual techniques;Passive range of motion;Taping;Vasopneumatic Device    PT Next Visit Plan Follow up on Home program compliance/understanding. Exercises to focus on scapular mms strengthening, RTC strengthening, gentle joint mobilizations, shoulder ROM, grip strength. Ice/heat/E-stim as needed.    PT Home Exercise Plan Scapular retractions, Shoulder ER stretch, seated gripping towel    Consulted and Agree with Plan of Care Patient           Patient will benefit from skilled therapeutic intervention in order to improve the following deficits and impairments:  Decreased range of motion,Increased fascial restricitons,Impaired UE functional use,Pain,Postural dysfunction,Decreased strength,Decreased mobility  Visit Diagnosis: Right shoulder pain, unspecified chronicity - Plan: PT plan of care cert/re-cert  Muscle weakness (generalized) - Plan: PT plan of care cert/re-cert  Shoulder subluxation, right, subsequent encounter - Plan: PT plan of care cert/re-cert  Stiffness of right shoulder, not elsewhere classified     Problem List Patient Active Problem List   Diagnosis Date Noted  . Shoulder subluxation, right, subsequent encounter 04/12/2020  . Diabetes mellitus (HCC) 06/02/2017  . Gastroesophageal reflux disease without esophagitis 04/30/2017  . Essential hypertension 10/10/2015  . History of asthma 10/10/2015  . History of migraine headaches 10/10/2015  . Bipolar disorder, unspecified (HCC) 11/20/2011    Mark Lindsey, PT, DPT 04/30/2020, 5:04 PM  [facility] [address] [place], [place], [address] Phone: [phone]   Fax:  [phone]  Name: Mark Lindsey MRN: [id number] Date of Birth: 1981-01-18

## 2020-05-04 ENCOUNTER — Ambulatory Visit: Payer: Worker's Compensation | Attending: Family Medicine

## 2020-05-04 ENCOUNTER — Other Ambulatory Visit: Payer: Self-pay

## 2020-05-04 DIAGNOSIS — S43001D Unspecified subluxation of right shoulder joint, subsequent encounter: Secondary | ICD-10-CM | POA: Insufficient documentation

## 2020-05-04 DIAGNOSIS — M6281 Muscle weakness (generalized): Secondary | ICD-10-CM | POA: Diagnosis present

## 2020-05-04 DIAGNOSIS — M25511 Pain in right shoulder: Secondary | ICD-10-CM | POA: Insufficient documentation

## 2020-05-04 DIAGNOSIS — M25611 Stiffness of right shoulder, not elsewhere classified: Secondary | ICD-10-CM | POA: Insufficient documentation

## 2020-05-04 NOTE — Therapy (Signed)
[facility] [place] [address] [place], [place], [address] Phone: [phone]   Fax:  [phone]  Physical Therapy Treatment  Patient Details  Name: Mark Lindsey MRN: [id number] Date of Birth: 11/10/1980 Referring Provider (PT): Mark Lindsey Date: 05/04/2020   PT End of Session - 05/04/20 0920    Visit Number 2    Number of Visits 17    Date for PT Re-Evaluation 06/25/20    PT Start Time 0802    PT Stop Time 0850    PT Time Calculation (min) 48 min    Activity Tolerance Patient tolerated treatment well;Patient limited by pain    Behavior During Therapy [facility] for tasks assessed/performed           Past Medical History:  Diagnosis Date  . Bipolar disease, chronic (HCC)   . Diabetes mellitus without complication (HCC)   . Hypertension   . Kidney stones   . Renal disorder     History reviewed. No pertinent surgical history.  There were no vitals filed for this visit.   Subjective Assessment - 05/04/20 0806    Subjective Pt reports he lost HEP papers and only remembered 2 of the exercises which he was doing, asked for another copy.  Some achiness after doing stretch at door. Still gets pain into the elbow occasionally. Feeling some numbness and tingling into right hand today. but lately pain has been getting better t/o the day.    Patient Stated Goals To get stronger, to be able to lift and reach overhead, to be able to use right arm normally for home and work activities. 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 Yes    Pain Score 6     Pain Location Shoulder    Pain Orientation Right    Pain Descriptors / Indicators Aching;Tightness;Tingling                             [facility] Adult PT Treatment/Exercise - 05/04/20 0001      Exercises   Exercises Shoulder;Wrist;Other Exercises    Other Exercises  ball squeezes using small stress balls 10x2 with 5 sec squeezes       Shoulder Exercises: Seated   Retraction AROM;20 reps   retractions with 5" holds; scapular clocks (elevation, retraction, depression) short range x20     Shoulder Exercises: ROM/Strengthening   UBE (Upper Arm Bike) 1.5 min, fwd   attempted 10 sec retro with poor tolerance.   Ranger flexion/extension and CW circles, gravity eliminated at 90 deg x20; forward flexion to 110deg in standing x20      Shoulder Exercises: Stretch   Table Stretch - Flexion 5 reps    Other Shoulder Stretches Standing wall finger walks x 5      Modalities   Modalities Electrical Stimulation;Moist Heat      Moist Heat Therapy   Number Minutes Moist Heat 10 Minutes    Moist Heat Location Shoulder   RIGHT     Electrical Stimulation   Electrical Stimulation Location Right shoulder    Electrical Stimulation Action Premod   performed with moist heat   Electrical Stimulation Parameters 10 min    Electrical Stimulation Goals Pain                  PT Education - 05/04/20 0919    Education Details Access Code: [email or web address]  URL:  [email or web address]  Date: 05/04/2020  Prepared by: Mark Lindsey    Exercises  Seated Scapular Retraction - 1 x daily - 7 x weekly - 2 sets - 10 reps  Seated Gripping small hand Towel - 1 x daily - 7 x weekly - 3 sets - 10 reps  Standing Shoulder External Rotation Stretch in Doorway - 1 x daily - 7 x weekly - 3 sets 20 sec  Circular Shoulder Pendulum with Table Support - 1 x daily - 7 x weekly - 3 sets - 10 reps  Seated Shoulder Flexion Slide at Table Top with Forearm in Neutral - 1 x daily - 7 x weekly - 3 sets - 10 reps    Person(s) Educated Patient    Methods Explanation;Demonstration;Handout    Comprehension Verbalized understanding;Returned demonstration            PT Short Term Goals - 04/30/20 1115      PT SHORT TERM GOAL #1   Title Pt will demonstrate independence with initial HEP    Time 2    Period Weeks    Status New    Target Date 05/18/20      PT  SHORT TERM GOAL #2   Title Pt will demo at least 120 degrees of passive right shoulder abduction and flexion.    Time 2    Period Weeks    Status New    Target Date 05/18/20             PT Long Term Goals - 04/30/20 1119      PT LONG TERM GOAL #1   Title Pt will demonstrate gross RUE strength equivalent to LUE.    Baseline LUE grossly 4/5. RUE grossly 3-/5.    Time 8    Period Weeks    Status New    Target Date 06/25/20      PT LONG TERM GOAL #2   Title Pt will attain right shoulder flexion/abduction to at least 160 degrees with </= 1/10 pain to facilitate ability to reach overhead .    Baseline Shoulder flexion AROM: R = 75degrees, L = 170    Time 8    Period Weeks    Status New    Target Date 06/25/20      PT LONG TERM GOAL #3   Title Pt will report able to return to work with </= 1/10 pain in right shoulder    Baseline Unable to work    Time 8    Period Weeks    Status New    Target Date 06/25/20      PT LONG TERM GOAL #4   Title Pt will achieve grip strength on the right within at least 5 lbs of the uninvolved upper extremity.    Baseline Right= 16, Left = 40    Time 8    Period Weeks    Status New    Target Date 06/25/20                 Plan - 05/04/20 7782    Clinical Impression Statement Mark Lindsey tolerated exercises well today. He tolerated right shoulder AAROM using UE ranger nicely, but with fatigue towards end of repetitions. Updated HEP papers were provided today with addition of pendulums and seated table shoulder flexion. Limited tolerance to UBE today due to pain and fatigue (worse with retro compared to forward).    Personal Factors and Comorbidities Fitness;Comorbidity 2    Comorbidities type 2 diabetes, hypertension  Examination-Activity Limitations Bathing;Caring for Others;Carry;Dressing;Hygiene/Grooming;Lift;Reach Overhead   push/pull   Examination-Participation Restrictions Occupation    Rehab Potential Good    PT Frequency 2x / week     PT Duration 8 weeks    PT Treatment/Interventions ADLs/Self Care Home Management;Cryotherapy;Electrical Stimulation;Moist Heat;Functional mobility training;Therapeutic activities;Therapeutic exercise;Neuromuscular re-education;Patient/family education;Manual techniques;Passive range of motion;Taping;Vasopneumatic Device    PT Next Visit Plan Follow up on Home program compliance/understanding. Exercises to focus on scapular mms strengthening, RTC strengthening, gentle joint mobilizations as appropriate , shoulder ROM, grip strength. Ice/heat/E-stim as needed. Continue to progress ROM and TE as tolerated.    Consulted and Agree with Plan of Care Patient           Patient will benefit from skilled therapeutic intervention in order to improve the following deficits and impairments:  Decreased range of motion,Increased fascial restricitons,Impaired UE functional use,Pain,Postural dysfunction,Decreased strength,Decreased mobility  Visit Diagnosis: Right shoulder pain, unspecified chronicity  Muscle weakness (generalized)  Shoulder subluxation, right, subsequent encounter  Stiffness of right shoulder, not elsewhere classified     Problem List Patient Active Problem List   Diagnosis Date Noted  . Shoulder subluxation, right, subsequent encounter 04/12/2020  . Diabetes mellitus (HCC) 06/02/2017  . Gastroesophageal reflux disease without esophagitis 04/30/2017  . Essential hypertension 10/10/2015  . History of asthma 10/10/2015  . History of migraine headaches 10/10/2015  . Bipolar disorder, unspecified (HCC) 11/20/2011    Anson Crofts, PT, DPT 05/04/2020, 11:57 AM  [facility] [address] [place], [place], [address] Phone: [phone]   Fax:  [phone]  Name: Mark Lindsey MRN: [id number] Date of Birth: 09-19-1980

## 2020-05-08 ENCOUNTER — Ambulatory Visit: Payer: Worker's Compensation | Attending: Family Medicine

## 2020-05-10 ENCOUNTER — Other Ambulatory Visit: Payer: Self-pay

## 2020-05-10 ENCOUNTER — Encounter: Payer: Self-pay | Admitting: Physical Therapy

## 2020-05-10 ENCOUNTER — Ambulatory Visit: Payer: Worker's Compensation | Admitting: Physical Therapy

## 2020-05-10 DIAGNOSIS — M25611 Stiffness of right shoulder, not elsewhere classified: Secondary | ICD-10-CM

## 2020-05-10 DIAGNOSIS — S43001D Unspecified subluxation of right shoulder joint, subsequent encounter: Secondary | ICD-10-CM

## 2020-05-10 DIAGNOSIS — M25511 Pain in right shoulder: Secondary | ICD-10-CM | POA: Diagnosis not present

## 2020-05-10 DIAGNOSIS — M6281 Muscle weakness (generalized): Secondary | ICD-10-CM

## 2020-05-10 NOTE — Therapy (Signed)
[facility] [address] [place], [place], [address] Phone: [phone]   Fax:  [phone]  Physical Therapy Treatment  Patient Details  Name: Mark Lindsey MRN: [id number] Date of Birth: 07/13/1980 Referring Provider (PT): Lenn Cal Date: 05/10/2020   PT End of Session - 05/10/20 0929    Visit Number 3    Number of Visits 17    Date for PT Re-Evaluation 06/25/20    PT Start Time 0842    PT Stop Time 0936    PT Time Calculation (min) 54 min    Activity Tolerance Patient tolerated treatment well    Behavior During Therapy [facility] for tasks assessed/performed           Past Medical History:  Diagnosis Date  . Bipolar disease, chronic (HCC)   . Diabetes mellitus without complication (HCC)   . Hypertension   . Kidney stones   . Renal disorder     History reviewed. No pertinent surgical history.  There were no vitals filed for this visit.   Subjective Assessment - 05/10/20 0841    Subjective thinks the exercises are doing their job, able to sleep more than 2 hours at a time now    Currently in Pain? Yes    Pain Score 4     Pain Location Shoulder    Pain Orientation Right                             [facility] Adult PT Treatment/Exercise - 05/10/20 0001      Shoulder Exercises: Standing   External Rotation Strengthening;Right;20 reps;Theraband    Theraband Level (Shoulder External Rotation) Level 2 (Red)    Internal Rotation Theraband;20 reps;Right    Theraband Level (Shoulder Internal Rotation) Level 2 (Red)    Flexion Weights;20 reps;Both;Strengthening    ABduction AROM;20 reps    Other Standing Exercises AAROM flex, ext, IR up back 1lb bar x 10 each    Other Standing Exercises weighted ball behind back passes x10 each      Shoulder Exercises: ROM/Strengthening   UBE (Upper Arm Bike) L1 3 min each    Other ROM/Strengthening Exercises Rows & Lats 20lb 2x10      Modalities    Modalities Electrical Stimulation;Moist Heat      Moist Heat Therapy   Number Minutes Moist Heat 11 Minutes    Moist Heat Location Shoulder      Electrical Stimulation   Electrical Stimulation Location Right shoulder    Electrical Stimulation Action IFC    Electrical Stimulation Parameters 11    Electrical Stimulation Goals Pain      Manual Therapy   Manual Therapy Passive ROM    Passive ROM R shoulder in all directions.                    PT Short Term Goals - 04/30/20 1115      PT SHORT TERM GOAL #1   Title Pt will demonstrate independence with initial HEP    Time 2    Period Weeks    Status New    Target Date 05/18/20      PT SHORT TERM GOAL #2   Title Pt will demo at least 120 degrees of passive right shoulder abduction and flexion.    Time 2    Period Weeks    Status New    Target Date 05/18/20  PT Long Term Goals - 04/30/20 1119      PT LONG TERM GOAL #1   Title Pt will demonstrate gross RUE strength equivalent to LUE.    Baseline LUE grossly 4/5. RUE grossly 3-/5.    Time 8    Period Weeks    Status New    Target Date 06/25/20      PT LONG TERM GOAL #2   Title Pt will attain right shoulder flexion/abduction to at least 160 degrees with </= 1/10 pain to facilitate ability to reach overhead .    Baseline Shoulder flexion AROM: R = 75degrees, L = 170    Time 8    Period Weeks    Status New    Target Date 06/25/20      PT LONG TERM GOAL #3   Title Pt will report able to return to work with </= 1/10 pain in right shoulder    Baseline Unable to work    Time 8    Period Weeks    Status New    Target Date 06/25/20      PT LONG TERM GOAL #4   Title Pt will achieve grip strength on the right within at least 5 lbs of the uninvolved upper extremity.    Baseline Right= 16, Left = 40    Time 8    Period Weeks    Status New    Target Date 06/25/20                 Plan - 05/10/20 0930    Clinical Impression Statement Pt  did well with a progressed treatment session. R shoulder ROM was limited with AAROM activities. He report some R shoulder popping and weakness with ER/IR. Some difficulty noted with standing shoulder flexion. Pt had full PROM today, some R shoulder protrusion noted with IR with shoulder abducted to 90.    Personal Factors and Comorbidities Fitness;Comorbidity 2    Comorbidities type 2 diabetes, hypertension    Examination-Participation Restrictions Occupation    Stability/Clinical Decision Making Evolving/Moderate complexity    Rehab Potential Good    PT Frequency 2x / week    PT Duration 8 weeks    PT Treatment/Interventions ADLs/Self Care Home Management;Cryotherapy;Electrical Stimulation;Moist Heat;Functional mobility training;Therapeutic activities;Therapeutic exercise;Neuromuscular re-education;Patient/family education;Manual techniques;Passive range of motion;Taping;Vasopneumatic Device    PT Next Visit Plan Exercises to focus on scapular mms strengthening, RTC strengthening, gentle joint mobilizations as appropriate , shoulder ROM, grip strength. Ice/heat/E-stim as needed. Continue to progress ROM and TE as tolerated.           Patient will benefit from skilled therapeutic intervention in order to improve the following deficits and impairments:  Decreased range of motion,Increased fascial restricitons,Impaired UE functional use,Pain,Postural dysfunction,Decreased strength,Decreased mobility  Visit Diagnosis: Right shoulder pain, unspecified chronicity  Muscle weakness (generalized)  Shoulder subluxation, right, subsequent encounter  Stiffness of right shoulder, not elsewhere classified     Problem List Patient Active Problem List   Diagnosis Date Noted  . Shoulder subluxation, right, subsequent encounter 04/12/2020  . Diabetes mellitus (HCC) 06/02/2017  . Gastroesophageal reflux disease without esophagitis 04/30/2017  . Essential hypertension 10/10/2015  . History of asthma  10/10/2015  . History of migraine headaches 10/10/2015  . Bipolar disorder, unspecified (HCC) 11/20/2011    Grayce Sessions, PTA 05/10/2020, 9:34 AM  [facility] [address] [place], [place], [address] Phone: [phone]   Fax:  [phone]  Name: Johnella Moloney  MRN: [id number] Date of Birth: 1981-04-02

## 2020-05-15 ENCOUNTER — Ambulatory Visit: Payer: 59 | Admitting: Physical Therapy

## 2020-05-17 ENCOUNTER — Ambulatory Visit: Payer: Worker's Compensation

## 2020-05-22 ENCOUNTER — Ambulatory Visit: Payer: Worker's Compensation | Admitting: Physical Therapy

## 2020-05-24 ENCOUNTER — Ambulatory Visit: Payer: 59 | Admitting: Family Medicine

## 2020-05-24 ENCOUNTER — Ambulatory Visit: Payer: Worker's Compensation | Admitting: Physical Therapy

## 2020-05-31 ENCOUNTER — Encounter: Payer: Self-pay | Admitting: Physical Therapy

## 2020-05-31 ENCOUNTER — Other Ambulatory Visit: Payer: Self-pay

## 2020-05-31 ENCOUNTER — Ambulatory Visit: Payer: Worker's Compensation | Attending: Family Medicine | Admitting: Physical Therapy

## 2020-05-31 DIAGNOSIS — M25511 Pain in right shoulder: Secondary | ICD-10-CM | POA: Insufficient documentation

## 2020-05-31 DIAGNOSIS — M6281 Muscle weakness (generalized): Secondary | ICD-10-CM | POA: Insufficient documentation

## 2020-05-31 DIAGNOSIS — M25611 Stiffness of right shoulder, not elsewhere classified: Secondary | ICD-10-CM | POA: Diagnosis present

## 2020-05-31 DIAGNOSIS — S43001D Unspecified subluxation of right shoulder joint, subsequent encounter: Secondary | ICD-10-CM | POA: Insufficient documentation

## 2020-05-31 NOTE — Therapy (Signed)
[facility]. [place], [place], [address] Phone: [phone]   Fax:  [phone]  Physical Therapy Treatment  Patient Details  Name: Mark Lindsey MRN: [id number] Date of Birth: 04-13-1981 Referring Provider (PT): Clinton Quant Date: 05/31/2020   PT End of Session - 05/31/20 0923    Visit Number 4    Number of Visits 17    Date for PT Re-Evaluation 06/25/20    PT Start Time 0844    PT Stop Time 0926    PT Time Calculation (min) 42 min    Activity Tolerance Patient tolerated treatment well    Behavior During Therapy [facility] for tasks assessed/performed           Past Medical History:  Diagnosis Date  . Bipolar disease, chronic ([place])   . Diabetes mellitus without complication ([place])   . Hypertension   . Kidney stones   . Renal disorder     History reviewed. No pertinent surgical history.  There were no vitals filed for this visit.   Subjective Assessment - 05/31/20 0844    Subjective Doing good no pain, I just need my strength back    Currently in Pain? No/denies              [facility] PT Assessment - 05/31/20 0001      AROM   Right Shoulder Flexion 173 Degrees   4/10 pin   Right Shoulder ABduction 174 Degrees   0/10 pain                        [facility] Adult PT Treatment/Exercise - 05/31/20 0001      Shoulder Exercises: Standing   External Rotation Strengthening;Right;20 reps;Theraband    Theraband Level (Shoulder External Rotation) Level 3 (Green)    Internal Rotation Theraband;20 reps;Right;Strengthening    Theraband Level (Shoulder Internal Rotation) Level 3 (Green)    Flexion Weights;20 reps;Both;Strengthening    Shoulder Flexion Weight (lbs) 3    ABduction Strengthening;Both;20 reps;Weights    Shoulder ABduction Weight (lbs) 3    Extension Weights;20 reps;Strengthening;Both    Extension Weight (lbs) 10    Other Standing Exercises RUE ER abd to 90 yellow 2x10    Other  Standing Exercises Tricept Ext 35lb 3x15; Biceps curls 20lb 2x15      Shoulder Exercises: ROM/Strengthening   UBE (Upper Arm Bike) L1 3 min each    Other ROM/Strengthening Exercises Rows & Lats 25l 2x10    Other ROM/Strengthening Exercises Chest press 10lb 2x12                    PT Short Term Goals - 04/30/20 1115      PT SHORT TERM GOAL #1   Title Pt will demonstrate independence with initial HEP    Time 2    Period Weeks    Status New    Target Date 05/18/20      PT SHORT TERM GOAL #2   Title Pt will demo at least 120 degrees of passive right shoulder abduction and flexion.    Time 2    Period Weeks    Status New    Target Date 05/18/20             PT Long Term Goals - 05/31/20 0927      PT LONG TERM GOAL #1   Title Pt will demonstrate gross RUE strength equivalent to LUE.    Status  Partially Met      PT LONG TERM GOAL #2   Title Pt will attain right shoulder flexion/abduction to at least 160 degrees with </= 1/10 pain to facilitate ability to reach overhead .    Status Partially Met      PT LONG TERM GOAL #3   Title Pt will report able to return to work with </= 1/10 pain in right shoulder    Status On-going      PT LONG TERM GOAL #4   Title Pt will achieve grip strength on the right within at least 5 lbs of the uninvolved upper extremity.    Status On-going                 Plan - 05/31/20 0539    Clinical Impression Statement PT has progressed increasing his RUE AROM, he has achieved his ROM goal but has some pain with flexion. Increase resistance tolerated with rows and lats. Some assist needed getting RUE in appropriate position to do ER with arm abducted to 90. He did report some anterior R shoulder pain with biceps curls.Pt denied modality post treatment, elected to uses at home heating pad.    Personal Factors and Comorbidities Fitness;Comorbidity 2    Comorbidities type 2 diabetes, hypertension    Examination-Activity Limitations  Bathing;Caring for Others;Carry;Dressing;Hygiene/Grooming;Lift;Reach Overhead    Examination-Participation Restrictions Occupation    Stability/Clinical Decision Making Evolving/Moderate complexity    Rehab Potential Good    PT Frequency 2x / week    PT Duration 8 weeks    PT Next Visit Plan Exercises to focus on scapular mms strengthening, RTC strengthening, gentle joint mobilizations as appropriate , shoulder ROM, grip strength. Ice/heat/E-stim as needed. Continue to progress ROM and TE as tolerated.           Patient will benefit from skilled therapeutic intervention in order to improve the following deficits and impairments:  Decreased range of motion,Increased fascial restricitons,Impaired UE functional use,Pain,Postural dysfunction,Decreased strength,Decreased mobility  Visit Diagnosis: Right shoulder pain, unspecified chronicity  Shoulder subluxation, right, subsequent encounter  Stiffness of right shoulder, not elsewhere classified  Muscle weakness (generalized)     Problem List Patient Active Problem List   Diagnosis Date Noted  . Shoulder subluxation, right, subsequent encounter 04/12/2020  . Diabetes mellitus ([place]) 06/02/2017  . Gastroesophageal reflux disease without esophagitis 04/30/2017  . Essential hypertension 10/10/2015  . History of asthma 10/10/2015  . History of migraine headaches 10/10/2015  . Bipolar disorder, unspecified ([place]) 11/20/2011    Scot Jun, PTA 05/31/2020, 9:28 AM  [facility]. [place], [place], [address] Phone: [phone]   Fax:  [phone]  Name: GILES CURRIE MRN: [id number] Date of Birth: 08-25-1980

## 2020-06-04 ENCOUNTER — Ambulatory Visit: Payer: Worker's Compensation

## 2020-06-05 ENCOUNTER — Ambulatory Visit (INDEPENDENT_AMBULATORY_CARE_PROVIDER_SITE_OTHER): Payer: Worker's Compensation | Admitting: Family Medicine

## 2020-06-05 ENCOUNTER — Other Ambulatory Visit: Payer: Self-pay

## 2020-06-05 DIAGNOSIS — S43001D Unspecified subluxation of right shoulder joint, subsequent encounter: Secondary | ICD-10-CM | POA: Diagnosis not present

## 2020-06-05 NOTE — Assessment & Plan Note (Addendum)
Injury occurred on at work on 12/21.  Still having weakness and instability with concern for labral tear.  His range of motion has improved. -Counseled on home exercise therapy and supportive care. -Provided work note. -Continue physical therapy. -MRI to evaluate for labral tear.

## 2020-06-05 NOTE — Progress Notes (Signed)
  Mark Lindsey - 40 y.o. male MRN [id number]  Date of birth: [birth year]  SUBJECTIVE:  Including CC & ROS.  No chief complaint on file.   Mark Lindsey is a 40 y.o. male that is following up for his right shoulder pain after an injury sustained at work.  His range of motion has improved but his strength is still appreciated.  Has been going through physical therapy.   Review of Systems See HPI   HISTORY: Past Medical, Surgical, Social, and Family History Reviewed & Updated per EMR.   Pertinent Historical Findings include:  Past Medical History:  Diagnosis Date  . Bipolar disease, chronic (HCC)   . Diabetes mellitus without complication (HCC)   . Hypertension   . Kidney stones   . Renal disorder     No past surgical history on file.  Family History  Problem Relation Age of Onset  . Diabetes Mother   . Hypertension Mother     Social History   Socioeconomic History  . Marital status: Married    Spouse name: Not on file  . Number of children: Not on file  . Years of education: Not on file  . Highest education level: Not on file  Occupational History  . Not on file  Tobacco Use  . Smoking status: Former Smoker    Types: Cigarettes  . Smokeless tobacco: Never Used  Substance and Sexual Activity  . Alcohol use: No  . Drug use: Not Currently  . Sexual activity: Not on file  Other Topics Concern  . Not on file  Social History Narrative  . Not on file   Social Determinants of Health   Financial Resource Strain: Not on file  Food Insecurity: Not on file  Transportation Needs: Not on file  Physical Activity: Not on file  Stress: Not on file  Social Connections: Not on file  Intimate Partner Violence: Not on file     PHYSICAL EXAM:  VS: BP 134/82 (BP Location: Right Arm, Patient Position: Sitting, Cuff Size: Large)   Ht 6' (1.829 m)   Wt 270 lb (122.5 kg)   BMI 36.62 kg/m  Physical Exam Gen: NAD, alert, cooperative with exam, well-appearing MSK:   Right shoulder: Normal abduction and flexion. Normal internal and external rotation. Normal empty can test. Positive O'Brien's test. Neurovascularly intact     ASSESSMENT & PLAN:   Shoulder subluxation, right, subsequent encounter Injury occurred on at work on 12/21.  Still having weakness and instability with concern for labral tear.  His range of motion has improved. -Counseled on home exercise therapy and supportive care. -Provided work note. -Continue physical therapy. -MRI to evaluate for labral tear.

## 2020-06-05 NOTE — Patient Instructions (Signed)
Good to see you Please use ice as needed  Please continue physical therapy  Your case manager should schedule the MRI  Please send me a message in MyChart with any questions or updates.  We will have a virtual visit once the MRI is resulted.   --Dr. Jordan Likes

## 2020-06-07 ENCOUNTER — Telehealth: Payer: Self-pay | Admitting: Family Medicine

## 2020-06-13 ENCOUNTER — Encounter: Payer: Self-pay | Admitting: Family Medicine

## 2020-06-14 ENCOUNTER — Ambulatory Visit: Payer: 59 | Attending: Family Medicine | Admitting: Physical Therapy

## 2020-06-14 ENCOUNTER — Other Ambulatory Visit: Payer: Self-pay

## 2020-06-14 ENCOUNTER — Encounter: Payer: Self-pay | Admitting: Physical Therapy

## 2020-06-14 DIAGNOSIS — M6281 Muscle weakness (generalized): Secondary | ICD-10-CM | POA: Insufficient documentation

## 2020-06-14 DIAGNOSIS — M25611 Stiffness of right shoulder, not elsewhere classified: Secondary | ICD-10-CM | POA: Insufficient documentation

## 2020-06-14 DIAGNOSIS — M25511 Pain in right shoulder: Secondary | ICD-10-CM | POA: Insufficient documentation

## 2020-06-14 DIAGNOSIS — S43001D Unspecified subluxation of right shoulder joint, subsequent encounter: Secondary | ICD-10-CM | POA: Insufficient documentation

## 2020-06-14 NOTE — Therapy (Signed)
[facility]. [place], [place], [address] Phone: [phone]   Fax:  [phone]  Physical Therapy Treatment  Patient Details  Name: Mark Lindsey MRN: [id number] Date of Birth: 11-24-1980 Referring Provider (PT): Clinton Quant Date: 06/14/2020   PT End of Session - 06/14/20 0921    Visit Number 5    Number of Visits 17    Date for PT Re-Evaluation 06/25/20    PT Start Time 0845    PT Stop Time 0925    PT Time Calculation (min) 40 min    Activity Tolerance Patient tolerated treatment well;Patient limited by pain    Behavior During Therapy [facility] for tasks assessed/performed           Past Medical History:  Diagnosis Date  . Bipolar disease, chronic ([place])   . Diabetes mellitus without complication ([place])   . Hypertension   . Kidney stones   . Renal disorder     History reviewed. No pertinent surgical history.  There were no vitals filed for this visit.   Subjective Assessment - 06/14/20 0848    Subjective Pt reports hurting his shoulder yesterday, reached across the front of his body to close the door that was on his L side hearing an audible pop    Currently in Pain? Yes    Pain Score 6     Pain Location Shoulder    Pain Orientation Right              [facility] PT Assessment - 06/14/20 0001      AROM   Right Shoulder Flexion 107 Degrees    Right Shoulder ABduction 78 Degrees    Right Shoulder Internal Rotation 45 Degrees    Right Shoulder External Rotation 85 Degrees                         [facility] Adult PT Treatment/Exercise - 06/14/20 0001      Shoulder Exercises: Standing   External Rotation Strengthening;Right;20 reps;Theraband    Theraband Level (Shoulder External Rotation) Level 1 (Yellow)    Internal Rotation Theraband;20 reps;Right;Strengthening    Theraband Level (Shoulder Internal Rotation) Level 1 (Yellow)    Flexion Weights;20 reps;Both;Strengthening     Shoulder Flexion Weight (lbs) 1    ABduction Strengthening;Both;20 reps;Weights    Extension 20 reps;Strengthening;Both;Theraband    Theraband Level (Shoulder Extension) Level 2 (Red)    Row Theraband;20 reps;Strengthening;Both    Theraband Level (Shoulder Row) Level 2 (Red)    Other Standing Exercises AAROM flex, ext, IR up back 1lb bar x 10 each      Shoulder Exercises: ROM/Strengthening   UBE (Upper Arm Bike) L1 3 min each      Moist Heat Therapy   Number Minutes Moist Heat 11 Minutes    Moist Heat Location Shoulder                    PT Short Term Goals - 04/30/20 1115      PT SHORT TERM GOAL #1   Title Pt will demonstrate independence with initial HEP    Time 2    Period Weeks    Status New    Target Date 05/18/20      PT SHORT TERM GOAL #2   Title Pt will demo at least 120 degrees of passive right shoulder abduction and flexion.    Time 2    Period Weeks  Status New    Target Date 05/18/20             PT Long Term Goals - 05/31/20 0927      PT LONG TERM GOAL #1   Title Pt will demonstrate gross RUE strength equivalent to LUE.    Status Partially Met      PT LONG TERM GOAL #2   Title Pt will attain right shoulder flexion/abduction to at least 160 degrees with </= 1/10 pain to facilitate ability to reach overhead .    Status Partially Met      PT LONG TERM GOAL #3   Title Pt will report able to return to work with </= 1/10 pain in right shoulder    Status On-going      PT LONG TERM GOAL #4   Title Pt will achieve grip strength on the right within at least 5 lbs of the uninvolved upper extremity.    Status On-going                 Plan - 06/14/20 0917    Clinical Impression Statement Pt enters clinic reporting he hurt his R arm again closing the back door toe his house. HE has lost AROM with flexion, abduction, and with internal rotation. He reports calling the MD yesterday and was instructed to wear his sling, but he was not wearing it  today. Exercises were kept light within in pain free range. He reports having an MRI scheduled for March 15 th.    Personal Factors and Comorbidities Fitness;Comorbidity 2    Comorbidities type 2 diabetes, hypertension    Examination-Activity Limitations Bathing;Caring for Others;Carry;Dressing;Hygiene/Grooming;Lift;Reach Overhead    Examination-Participation Restrictions Occupation    Stability/Clinical Decision Making Evolving/Moderate complexity    Rehab Potential Good    PT Frequency 2x / week    PT Duration 8 weeks    PT Treatment/Interventions ADLs/Self Care Home Management;Cryotherapy;Electrical Stimulation;Moist Heat;Functional mobility training;Therapeutic activities;Therapeutic exercise;Neuromuscular re-education;Patient/family education;Manual techniques;Passive range of motion;Taping;Vasopneumatic Device    PT Next Visit Plan Informed Pt to call MD and ask about PT.           Patient will benefit from skilled therapeutic intervention in order to improve the following deficits and impairments:  Decreased range of motion,Increased fascial restricitons,Impaired UE functional use,Pain,Postural dysfunction,Decreased strength,Decreased mobility  Visit Diagnosis: Right shoulder pain, unspecified chronicity  Shoulder subluxation, right, subsequent encounter  Stiffness of right shoulder, not elsewhere classified  Muscle weakness (generalized)     Problem List Patient Active Problem List   Diagnosis Date Noted  . Shoulder subluxation, right, subsequent encounter 04/12/2020  . Diabetes mellitus ([place]) 06/02/2017  . Gastroesophageal reflux disease without esophagitis 04/30/2017  . Essential hypertension 10/10/2015  . History of asthma 10/10/2015  . History of migraine headaches 10/10/2015  . Bipolar disorder, unspecified ([place]) 11/20/2011    Scot Jun 06/14/2020, 9:22 AM  [facility] [place]. [place],  [place], [address] Phone: [phone]   Fax:  [phone]  Name: Mark Lindsey MRN: [id number] Date of Birth: 12-21-1980

## 2020-06-27 ENCOUNTER — Other Ambulatory Visit: Payer: Self-pay

## 2020-06-27 ENCOUNTER — Ambulatory Visit
Admission: EM | Admit: 2020-06-27 | Discharge: 2020-06-27 | Disposition: A | Discharge status: Home / Self Care | Payer: 59 | Attending: Emergency Medicine | Admitting: Emergency Medicine

## 2020-06-27 DIAGNOSIS — J069 Acute upper respiratory infection, unspecified: Secondary | ICD-10-CM

## 2020-06-27 MED ORDER — FLUTICASONE PROPIONATE 50 MCG/ACT NA SUSP: 1.0000 | Freq: Every day | NASAL | 0 refills | Status: DC

## 2020-06-27 MED ORDER — BENZONATATE 200 MG PO CAPS: 200.0000 mg | ORAL_CAPSULE | Freq: Three times a day (TID) | ORAL | 0 refills | Status: AC | PRN

## 2020-06-27 NOTE — ED Provider Notes (Signed)
[facility]    CSN: [id number] Arrival date & time: 06/27/20  0818      History   Chief Complaint Chief Complaint  Patient presents with  . Cough    HPI Mark Lindsey is a 40 y.o. male history of hypertension, DM type II, presenting today for evaluation of URI symptoms.  Reports that he has had cough, chest congestion and sore throat.  Symptoms x3 days.  Reports Covid infection last month.  Denies any difficulty breathing, chest pain or shortness of breath.  Denies fevers.  Reports 54-year-old son with similar symptoms, who has tested negative for Covid.  HPI  Past Medical History:  Diagnosis Date  . Bipolar disease, chronic (HCC)   . Diabetes mellitus without complication (HCC)   . Hypertension   . Kidney stones   . Renal disorder     Patient Active Problem List   Diagnosis Date Noted  . Shoulder subluxation, right, subsequent encounter 04/12/2020  . Diabetes mellitus (HCC) 06/02/2017  . Gastroesophageal reflux disease without esophagitis 04/30/2017  . Essential hypertension 10/10/2015  . History of asthma 10/10/2015  . History of migraine headaches 10/10/2015  . Bipolar disorder, unspecified (HCC) 11/20/2011    History reviewed. No pertinent surgical history.     Home Medications    Prior to Admission medications   Medication Sig Start Date End Date Taking? Authorizing Provider  benzonatate (TESSALON) 200 MG capsule Take 1 capsule (200 mg total) by mouth 3 (three) times daily as needed for up to 7 days for cough. 06/27/20 07/04/20 Yes Birdie Fetty C, PA-C  albuterol (VENTOLIN HFA) 108 (90 Base) MCG/ACT inhaler Inhale 2 puffs into the lungs every 6 (six) hours as needed for wheezing or shortness of breath. 03/04/20   Hall-Potvin, [place], PA-C  ALPRAZolam (XANAX) 0.25 MG tablet Xanax  1 tablet daily as needed    [provider]  azelastine (ASTELIN) 0.1 % nasal spray Place 2 sprays into both nostrils 2 (two) times daily. 07/04/19   Yu, Amy  V, PA-C  glimepiride (AMARYL) 2 MG tablet TAKE 1 TABLET BY MOUTH 30 MINUTES BEFORE BREAKFAST. 03/21/18   [provider]  ibuprofen (ADVIL) 800 MG tablet Take 1 tablet (800 mg total) by mouth 3 (three) times daily. 04/10/20   Keierra Nudo C, PA-C  insulin aspart (NOVOLOG) 100 UNIT/ML injection Inject 40 Units into the skin 2 (two) times daily.    [provider]  LamoTRIgine (LAMICTAL PO) Take by mouth.    [provider]  levocetirizine (XYZAL) 5 MG tablet Take 1 tablet (5 mg total) by mouth every evening. 10/13/19   Hall-Potvin, [place], PA-C  lisinopril (PRINIVIL,ZESTRIL) 20 MG tablet Take 20 mg by mouth daily.    [provider]  metFORMIN (GLUCOPHAGE) 500 MG tablet metformin  500 mg twice daily    [provider]  METFORMIN HCL PO Take by mouth.    [provider]  Omeprazole (PRILOSEC PO) Take by mouth.     [provider]  GLIPIZIDE PO Take by mouth.  02/04/20  [provider]    Family History Family History  Problem Relation Age of Onset  . Diabetes Mother   . Hypertension Mother     Social History Social History   Tobacco Use  . Smoking status: Former Smoker    Types: Cigarettes  . Smokeless tobacco: Never Used  Substance Use Topics  . Alcohol use: No  . Drug use: Not Currently     Allergies  Buspirone, Quetiapine fumarate, Septra [sulfamethoxazole-trimethoprim], Seroquel [quetiapine], and Sulfa antibiotics   Review of Systems Review of Systems  Constitutional: Negative for activity change, appetite change, chills, fatigue and fever.  HENT: Positive for congestion, rhinorrhea and sore throat. Negative for ear pain, sinus pressure and trouble swallowing.   Eyes: Negative for discharge and redness.  Respiratory: Positive for cough. Negative for chest tightness and shortness of breath.   Cardiovascular: Negative for chest pain.  Gastrointestinal: Negative for abdominal pain, diarrhea, nausea  and vomiting.  Musculoskeletal: Negative for myalgias.  Skin: Negative for rash.  Neurological: Negative for dizziness, light-headedness and headaches.     Physical Exam Triage Vital Signs ED Triage Vitals  Enc Vitals Group     BP      Pulse      Resp      Temp      Temp src      SpO2      Weight      Height      Head Circumference      Peak Flow      Pain Score      Pain Loc      Pain Edu?      Excl. in GC?    No data found.  Updated Vital Signs BP (!) 136/93 (BP Location: Left Arm)   Pulse 97   Temp 98.2 F (36.8 C) (Oral)   Resp 18   SpO2 96%   Visual Acuity Right Eye Distance:   Left Eye Distance:   Bilateral Distance:    Right Eye Near:   Left Eye Near:    Bilateral Near:     Physical Exam Vitals and nursing note reviewed.  Constitutional:      Appearance: He is well-developed and well-nourished.     Comments: No acute distress  HENT:     Head: Normocephalic and atraumatic.     Ears:     Comments: Bilateral ears without tenderness to palpation of external auricle, tragus and mastoid, EAC's without erythema or swelling, TM's with good bony landmarks and cone of light. Non erythematous.     Nose: Nose normal.     Mouth/Throat:     Comments: Oral mucosa pink and moist, no tonsillar enlargement or exudate. Posterior pharynx patent and nonerythematous, no uvula deviation or swelling. Normal phonation.  Eyes:     Conjunctiva/sclera: Conjunctivae normal.  Cardiovascular:     Rate and Rhythm: Normal rate.  Pulmonary:     Effort: Pulmonary effort is normal. No respiratory distress.  Abdominal:     General: There is no distension.  Musculoskeletal:        General: Normal range of motion.     Cervical back: Neck supple.  Skin:    General: Skin is warm and dry.  Neurological:     Mental Status: He is alert and oriented to person, place, and time.  Psychiatric:        Mood and Affect: Mood and affect normal.      UC Treatments / Results   Labs (all labs ordered are listed, but only abnormal results are displayed) Labs Reviewed - No data to display  EKG   Radiology No results found.  Procedures Procedures (including critical care time)  Medications Ordered in UC Medications - No data to display  Initial Impression / Assessment and Plan / UC Course  I have reviewed the triage vital signs and the nursing notes.  Pertinent labs & imaging results that were available during  my care of the patient were reviewed by me and considered in my medical decision making (see chart for details).     Viral URI with cough-recent COVID-19 infection, deferring further testing, exam reassuring, recommending further symptomatic and supportive care with continued monitoring of symptoms and breathing.  Rest and fluids.  Discussed strict return precautions. Patient verbalized understanding and is agreeable with plan.  Final Clinical Impressions(s) / UC Diagnoses   Final diagnoses:  Viral URI with cough     Discharge Instructions     May continue Coricidin for congestion May use over-the-counter Mucinex, Zyrtec/Claritin for further relief of congestion and drainage Tessalon for cough or may use over-the-counter Robitussin, Delsym Rest and fluids Tylenol and ibuprofen as needed for headaches, sore throat Follow-up if not improving or worsening    ED Prescriptions    Medication Sig Dispense Auth. Provider   benzonatate (TESSALON) 200 MG capsule Take 1 capsule (200 mg total) by mouth 3 (three) times daily as needed for up to 7 days for cough. 28 capsule Brain Honeycutt, [place] C, PA-C     PDMP not reviewed this encounter.   Lew Dawes, [place] 06/27/20 [phone]

## 2020-06-27 NOTE — Discharge Instructions (Signed)
May continue Coricidin for congestion May use over-the-counter Mucinex, Zyrtec/Claritin for further relief of congestion and drainage Tessalon for cough or may use over-the-counter Robitussin, Delsym Rest and fluids Tylenol and ibuprofen as needed for headaches, sore throat Follow-up if not improving or worsening

## 2020-06-27 NOTE — ED Triage Notes (Signed)
Pt c/o cough, sore throat, nasal and chest congestion x3 days. States had covid last month, this feels different.

## 2020-07-02 NOTE — Addendum Note (Signed)
Addended by: Annita Brod on: 07/02/2020 10:36 AM   Modules accepted: Orders

## 2020-07-03 ENCOUNTER — Other Ambulatory Visit: Payer: Self-pay | Admitting: Family Medicine

## 2020-07-03 DIAGNOSIS — S43001D Unspecified subluxation of right shoulder joint, subsequent encounter: Secondary | ICD-10-CM

## 2020-07-04 ENCOUNTER — Telehealth (INDEPENDENT_AMBULATORY_CARE_PROVIDER_SITE_OTHER): Payer: Worker's Compensation | Admitting: Family Medicine

## 2020-07-04 ENCOUNTER — Other Ambulatory Visit: Payer: Self-pay

## 2020-07-04 DIAGNOSIS — S43001D Unspecified subluxation of right shoulder joint, subsequent encounter: Secondary | ICD-10-CM

## 2020-07-04 NOTE — Progress Notes (Signed)
Virtual Visit via Video Note  I connected with Mark Lindsey on 07/04/20 at 10:10 AM EDT by a video enabled telemedicine application and verified that I am speaking with the correct person using two identifiers.  Location: Patient: home Provider: office   I discussed the limitations of evaluation and management by telemedicine and the availability of in person appointments. The patient expressed understanding and agreed to proceed.  History of Present Illness:  Mark Lindsey is a 40 year old male that is following up after an MRI of his right shoulder.  This is after an injury he sustained at work.  The MRI was demonstrating moderate tendinosis of the supraspinatus and of the long head of the biceps.  There is mild labral fraying superiorly with fluid within the biceps tendon.   Observations/Objective:  Gen: NAD, alert, cooperative with exam, well-appearing  Assessment and Plan:  Right shoulder subluxation subsequent encounter: MRI was revealing for only tendinosis and fraying of the labrum.  Did have some fluid within the biceps tendon is likely decompressing the glenohumeral joint. -Counseled on home exercise therapy and supportive care. -Continue physical therapy. -Could consider injection.  Follow Up Instructions:    I discussed the assessment and treatment plan with the patient. The patient was provided an opportunity to ask questions and all were answered. The patient agreed with the plan and demonstrated an understanding of the instructions.   The patient was advised to call back or seek an in-person evaluation if the symptoms worsen or if the condition fails to improve as anticipated.   Mark Gandy, MD

## 2020-07-04 NOTE — Assessment & Plan Note (Signed)
MRI was revealing for only tendinosis and fraying of the labrum.  Did have some fluid within the biceps tendon is likely decompressing the glenohumeral joint. -Counseled on home exercise therapy and supportive care. -Continue physical therapy. -Could consider injection.

## 2020-07-06 ENCOUNTER — Other Ambulatory Visit: Payer: Self-pay | Admitting: Family Medicine

## 2020-07-06 DIAGNOSIS — S43431A Superior glenoid labrum lesion of right shoulder, initial encounter: Secondary | ICD-10-CM

## 2020-07-06 DIAGNOSIS — S43001A Unspecified subluxation of right shoulder joint, initial encounter: Secondary | ICD-10-CM

## 2020-07-17 ENCOUNTER — Ambulatory Visit: Payer: Worker's Compensation | Attending: Family Medicine

## 2020-07-17 ENCOUNTER — Other Ambulatory Visit: Payer: Self-pay

## 2020-07-17 DIAGNOSIS — M25611 Stiffness of right shoulder, not elsewhere classified: Secondary | ICD-10-CM | POA: Insufficient documentation

## 2020-07-17 DIAGNOSIS — M6281 Muscle weakness (generalized): Secondary | ICD-10-CM | POA: Insufficient documentation

## 2020-07-17 DIAGNOSIS — S43001D Unspecified subluxation of right shoulder joint, subsequent encounter: Secondary | ICD-10-CM | POA: Insufficient documentation

## 2020-07-17 DIAGNOSIS — M25511 Pain in right shoulder: Secondary | ICD-10-CM | POA: Diagnosis present

## 2020-07-17 NOTE — Patient Instructions (Signed)
Access Code: [email or web address] URL: [email or web address] Date: 07/17/2020 Prepared by: Claude Manges  Exercises Standing Shoulder Internal Rotation Stretch with Towel - 1 x daily - 7 x weekly - 5 sets - 10 seconds hold Standing Shoulder Abduction Finger Walk at Wall - 1 x daily - 7 x weekly - 2 sets - 10 reps Isometric Punch at Wall - 1 x daily - 7 x weekly - 1 sets - 10 reps - 5 seconds hold Towel Roll Grip with Forearm in Neutral - 1 x daily - 7 x weekly - 1 sets - 10 reps - 5 sec hold Towel Roll Grip on Table - 1 x daily - 7 x weekly - 1 sets - 10 reps - 5 sec hold Standing Shoulder Horizontal Abduction with Resistance - 1 x daily - 7 x weekly - 1-2 sets - 10 reps Shoulder External Rotation and Scapular Retraction with Resistance - 1 x daily - 7 x weekly - 1-2 sets - 10 reps

## 2020-07-17 NOTE — Therapy (Signed)
[place]. [place], [place], [address] Phone: [phone]   Fax:  [phone]  Physical Therapy Treatment/Recertification  Patient Details  Name: Mark Lindsey MRN: [id number] Date of Birth: 04/12/1981 Referring Provider (PT): Clinton Quant Date: 07/17/2020   PT End of Session - 07/17/20 0858    Visit Number 6    Number of Visits 17    Date for PT Re-Evaluation 08/17/20    Authorization Type [employer] [place]    PT Start Time 0845    PT Stop Time 0930    PT Time Calculation (min) 45 min    Activity Tolerance Patient tolerated treatment well;Patient limited by pain;Patient limited by fatigue   muscle fatigue   Behavior During Therapy [facility] for tasks assessed/performed           Past Medical History:  Diagnosis Date  . Bipolar disease, chronic ([place])   . Diabetes mellitus without complication ([place])   . Hypertension   . Kidney stones   . Renal disorder     History reviewed. No pertinent surgical history.  There were no vitals filed for this visit.   Subjective Assessment - 07/17/20 0848    Subjective First time back since reinjuring shoulder. Was told to take a week off after initial injury but decided to take off  until after MRI on 07/04/20. Still has not returned to work, still does not have the strength. 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  Did have some fluid within the biceps tendon is likely decompressing the glenohumeral joint.    Patient Stated Goals To get stronger, to be able to lift and reach overhead, to be able to use right arm normally for home and work activities. 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 Yes    Pain Score 0-No pain   with cold weather pain is more aggravated   Pain Orientation Right    Pain Descriptors  / Indicators Aching   occasional tngling into pinky, continues to feel popping which will cause sharp pains             [facility] PT Assessment - 07/17/20 0001      AROM   Overall AROM  --   Functional ER: R to C7, L to T4. Functional IR: R to L2, L to T11   Right Shoulder Flexion 154 Degrees   end rangepulling pain   Right Shoulder ABduction 135 Degrees   140 passive, end ragne top of shoulder pull pain   Right Shoulder Internal Rotation 45 Degrees    Right Shoulder External Rotation 80 Degrees   increased pain                        [facility] Adult PT Treatment/Exercise - 07/17/20 0001      Shoulder Exercises: Standing   Horizontal ABduction Strengthening;Both;10 reps    Extension Strengthening;Both;Theraband;10 reps    Theraband Level (Shoulder Extension) Level 1 (Yellow)    Other Standing Exercises AAROM ext x 10    Other Standing Exercises Standing finger ladder x10 with 5" end range holds for flexion - no pain, scap stabilization ball on wall x10 CW/ x 10 CCW      Shoulder Exercises: ROM/Strengthening  UBE (Upper Arm Bike) --      Shoulder Exercises: Stretch   Internal Rotation Stretch 5 reps    Internal Rotation Stretch Limitations 10 " each,  towel stretch in standing                  PT Education - 07/17/20 0935    Education Details Updated PT POC and updated HEP: Access Code: [email or web address] (yellow TB)    Person(s) Educated Patient    Methods Explanation;Demonstration;Handout    Comprehension Verbalized understanding;Returned demonstration            PT Short Term Goals - 07/17/20 0946      PT SHORT TERM GOAL #1   Title Pt will demonstrate independence with updated HEP    Time 2    Period Weeks    Status New    Target Date 07/31/20             PT Long Term Goals - 07/17/20 0859      PT LONG TERM GOAL #1   Title Pt will demonstrate gross RUE strength equivalent to LUE.    Status Partially Met      PT LONG TERM GOAL #2   Title Pt  will attain right shoulder flexion/abduction to at least 160 degrees with </= 1/10 pain to facilitate ability to reach overhead .    Status Partially Met      PT LONG TERM GOAL #3   Title Pt will report able to return to work with </= 1/10 pain in right shoulder    Status On-going      PT LONG TERM GOAL #4   Title Pt will achieve grip strength on the right within at least 5 lbs of the uninvolved upper extremity.    Status On-going      PT LONG TERM GOAL #5   Title Pt will be able to lift and push various weighted objects to facilitate return to work    Time 6    Period Weeks    Status New    Target Date 08/28/20                 Plan - 07/17/20 0858    Clinical Impression Statement Mark Lindsey returned to physical therapy after 1 month pause after reinjuring right shoulder when closing a door. He demonstrates slight improvement in ROM compared to previous visit immediately post injury, however he is limited in end range ROM by tightness and pain. He continues to demo R shoulder/RUE weakness in all motions and continued c/o feeling instability especially at end ranges. He will continue to benefit from skilled PT to address continued RUE deficits, improve overall strength and functional UE use with plan to progress lifting/carrying tolerance to be able to return to work    Personal Factors and Comorbidities Fitness;Comorbidity 2    Comorbidities type 2 diabetes, hypertension    Examination-Activity Limitations Bathing;Caring for Others;Carry;Dressing;Hygiene/Grooming;Lift;Reach Overhead    Examination-Participation Restrictions Occupation    Rehab Potential Good    PT Frequency 1x / week    PT Duration 6 weeks    PT Treatment/Interventions ADLs/Self Care Home Management;Cryotherapy;Electrical Stimulation;Moist Heat;Functional mobility training;Therapeutic activities;Therapeutic exercise;Neuromuscular re-education;Patient/family education;Manual techniques;Passive range of  motion;Taping;Vasopneumatic Device    PT Next Visit Plan Reassess updated HEP and modify/advance as tolerated. Continue to work on increasing shoulder ROM, stability, RUE and grip strength. Gradually progress carrying/lifting and some closed chain stabilization, body mechanics as tolerated within next few sessions. Manual and modalities as  needed.    Consulted and Agree with Plan of Care Patient           Patient will benefit from skilled therapeutic intervention in order to improve the following deficits and impairments:  Decreased range of motion,Increased fascial restricitons,Impaired UE functional use,Pain,Postural dysfunction,Decreased strength,Decreased mobility  Visit Diagnosis: Right shoulder pain, unspecified chronicity - Plan: PT plan of care cert/re-cert  Shoulder subluxation, right, subsequent encounter - Plan: PT plan of care cert/re-cert  Stiffness of right shoulder, not elsewhere classified - Plan: PT plan of care cert/re-cert  Muscle weakness (generalized) - Plan: PT plan of care cert/re-cert     Problem List Patient Active Problem List   Diagnosis Date Noted  . Shoulder subluxation, right, subsequent encounter 04/12/2020  . Diabetes mellitus ([place]) 06/02/2017  . Gastroesophageal reflux disease without esophagitis 04/30/2017  . Essential hypertension 10/10/2015  . History of asthma 10/10/2015  . History of migraine headaches 10/10/2015  . Bipolar disorder, unspecified ([place]) 11/20/2011    Hall Busing, PT, DPT 07/17/2020, 9:51 AM  [place]. [place], [place], [address] Phone: [phone]   Fax:  [phone]  Name: Mark Lindsey MRN: [id number] Date of Birth: 1981/01/05

## 2020-07-24 ENCOUNTER — Ambulatory Visit: Payer: Worker's Compensation | Admitting: Physical Therapy

## 2020-07-31 ENCOUNTER — Other Ambulatory Visit: Payer: Self-pay

## 2020-07-31 ENCOUNTER — Ambulatory Visit: Payer: Worker's Compensation | Attending: Family Medicine | Admitting: Physical Therapy

## 2020-07-31 ENCOUNTER — Encounter: Payer: Self-pay | Admitting: Physical Therapy

## 2020-07-31 DIAGNOSIS — S43001D Unspecified subluxation of right shoulder joint, subsequent encounter: Secondary | ICD-10-CM | POA: Diagnosis present

## 2020-07-31 DIAGNOSIS — M25611 Stiffness of right shoulder, not elsewhere classified: Secondary | ICD-10-CM | POA: Diagnosis present

## 2020-07-31 DIAGNOSIS — M25511 Pain in right shoulder: Secondary | ICD-10-CM | POA: Diagnosis not present

## 2020-07-31 DIAGNOSIS — M6281 Muscle weakness (generalized): Secondary | ICD-10-CM | POA: Insufficient documentation

## 2020-07-31 NOTE — Therapy (Signed)
[place]. 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 Bipolar disease, chronic ([place])   . Diabetes mellitus without complication ([place])   . Hypertension   . Kidney stones   . Renal disorder     History reviewed. No pertinent surgical history.  There were no vitals filed for this visit.   Subjective Assessment - 07/31/20 0846    Subjective grip is getting better, shoulder gets campy/achy with activity    Currently in Pain? No/denies                             [facility] Adult PT Treatment/Exercise - 07/31/20 0001      Shoulder Exercises: Standing   External Rotation Strengthening;20 reps    Theraband Level (Shoulder External Rotation) Level 2 (Red)    Internal Rotation Strengthening;20 reps    Theraband Level (Shoulder Internal Rotation) Level 2 (Red)    Flexion Weights;20 reps;Both;Strengthening    Shoulder Flexion Weight (lbs) 3    Other Standing Exercises AAROM flex, ext, IR up back 1lb bar x 10 each 2lb WaTE bar    Other Standing Exercises wall slides with towel end range hold, 3 secs, for flexion and abduction, x 10      Shoulder Exercises: ROM/Strengthening   UBE (Upper Arm Bike) L1.5  3 min each    Other ROM/Strengthening Exercises Rows & Lats 25# 2x10     Other ROM/Strengthening Exercises Chest press 10lb 2x10                    PT Short Term Goals - 07/17/20 0946      PT SHORT TERM GOAL #1   Title Pt will demonstrate independence with updated HEP    Time 2    Period Weeks    Status New    Target Date 07/31/20             PT Long Term Goals - 07/17/20 0859      PT LONG TERM GOAL #1   Title Pt will demonstrate gross RUE strength equivalent to LUE.    Status Partially Met      PT LONG TERM GOAL #2   Title Pt will attain right shoulder flexion/abduction to at least 160 degrees with </= 1/10 pain to facilitate ability to reach overhead .    Status Partially Met      PT LONG TERM GOAL #3   Title Pt will report able to return to work with </= 1/10 pain in right shoulder    Status On-going      PT LONG TERM GOAL #4   Title Pt  will achieve grip strength on the right within at least 5 lbs of the uninvolved upper extremity.    Status On-going      PT LONG TERM GOAL #5   Title Pt will be able to lift and push various weighted objects to facilitate return to work    Time 6    Period Weeks    Status New    Target Date 08/28/20                 Plan - 07/31/20 0918    Clinical Impression Statement Pt reports compliance with HEP. All interventions completed but report some R shoulder fatigue. Tactile cues needed with keep shoulders down with lat pull downs and seated rows. Tactile cues for arm placement needed with standing ER/IR.    Personal Factors and Comorbidities Fitness;Comorbidity 2    Comorbidities type 2 diabetes, hypertension    Examination-Activity Limitations Bathing;Caring for Others;Carry;Dressing;Hygiene/Grooming;Lift;Reach Overhead    Examination-Participation Restrictions Occupation    Stability/Clinical Decision Making Evolving/Moderate complexity    Rehab Potential Good    PT Frequency 1x / week    PT Duration 6 weeks    PT Treatment/Interventions ADLs/Self Care Home  Management;Cryotherapy;Electrical Stimulation;Moist Heat;Functional mobility training;Therapeutic activities;Therapeutic exercise;Neuromuscular re-education;Patient/family education;Manual techniques;Passive range of motion;Taping;Vasopneumatic Device    PT Next Visit Plan Reassess updated HEP and modify/advance as tolerated. Continue to work on increasing shoulder ROM, stability, RUE and grip strength. Gradually progress carrying/lifting and some closed chain stabilization, body mechanics as tolerated within next few sessions. Manual and modalities as needed.           Patient will benefit from skilled therapeutic intervention in order to improve the following deficits and impairments:  Decreased range of motion,Increased fascial restricitons,Impaired UE functional use,Pain,Postural dysfunction,Decreased strength,Decreased mobility  Visit Diagnosis: Right shoulder pain, unspecified chronicity  Shoulder subluxation, right, subsequent encounter  Stiffness of right shoulder, not elsewhere classified  Muscle weakness (generalized)     Problem List Patient Active Problem List   Diagnosis Date Noted  . Shoulder subluxation, right, subsequent encounter 04/12/2020  . Diabetes mellitus ([place]) 06/02/2017  . Gastroesophageal reflux disease without esophagitis 04/30/2017  . Essential hypertension 10/10/2015  . History of asthma 10/10/2015  . History of migraine headaches 10/10/2015  . Bipolar disorder, unspecified ([place]) 11/20/2011    Scot Jun, PTA 07/31/2020, 9:21 AM  [place]. [place], [place], [address] Phone: [phone]   Fax:  [phone]  Name: Mark Lindsey MRN: [id number] Date of Birth: [birth year]

## 2020-08-06 ENCOUNTER — Ambulatory Visit: Payer: Worker's Compensation | Admitting: Family Medicine

## 2020-08-07 ENCOUNTER — Ambulatory Visit: Payer: Worker's Compensation

## 2020-08-07 ENCOUNTER — Other Ambulatory Visit: Payer: Self-pay

## 2020-08-07 DIAGNOSIS — M25511 Pain in right shoulder: Secondary | ICD-10-CM

## 2020-08-07 DIAGNOSIS — M6281 Muscle weakness (generalized): Secondary | ICD-10-CM

## 2020-08-07 DIAGNOSIS — S43001D Unspecified subluxation of right shoulder joint, subsequent encounter: Secondary | ICD-10-CM

## 2020-08-07 DIAGNOSIS — M25611 Stiffness of right shoulder, not elsewhere classified: Secondary | ICD-10-CM

## 2020-08-07 NOTE — Therapy (Signed)
[place]. 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 Bipolar disease, chronic ([place])   . Diabetes mellitus without complication ([place])   . Hypertension   . Kidney stones   . Renal disorder     History reviewed. No pertinent surgical history.  There were no vitals filed for this visit.   Subjective Assessment - 08/07/20 0804    Subjective Doing okay. Feeling a bit sore this morning doesnt know why, maybe slept on it wrong or the weather    Diagnostic tests per MD note MRI R shoulder 07/04/2020: MRI was revealing for only tendinosis and fraying of the labrum.  Did have some fluid within the biceps tendon is likely decompressing the glenohumeral joint.    Patient Stated Goals To get stronger, to be able to lift and reach overhead, to be able to use right arm normally for home and work activities. 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 Yes    Pain Score 2     Pain Location Shoulder    Pain Orientation Right    Pain Descriptors / Indicators Sore              [facility] Adult PT Treatment/Exercise - 08/07/20 0001      Shoulder Exercises: Standing   External Rotation Strengthening;20 reps    Theraband Level (Shoulder External Rotation) Level 2 (Red)     Internal Rotation Strengthening;20 reps    Theraband Level (Shoulder Internal Rotation) Level 2 (Red)    Flexion Weights;20 reps;Both;Strengthening    Shoulder Flexion Weight (lbs) 3    Other Standing Exercises AAROM flex, ext, IR up back 1lb bar x 10 each 2lb WaTE bar    Other Standing Exercises Wall protraction x 10. wall scap stabilization with yellow TB around forearms - up and lateral taps 5 x 2 B. Pulley PNF D1 flexion 5# x 10      Shoulder Exercises: ROM/Strengthening   UBE (Upper Arm Bike) L1.5  3 min each    Other ROM/Strengthening Exercises Lats 25# 2x10    Other ROM/Strengthening Exercises Chest press 10lb 2x10 - pulleys      Wrist Exercises   Other wrist exercises Bar alternating wrist flex/ext palm up x 4 (difficult), palms down x 5                    PT Short Term Goals - 07/17/20 0946      PT SHORT TERM GOAL #1   Title Pt will demonstrate independence with updated HEP    Time 2    Period Weeks    Status New    Target  Date 07/31/20             PT Long Term Goals - 07/17/20 0859      PT LONG TERM GOAL #1   Title Pt will demonstrate gross RUE strength equivalent to LUE.    Status Partially Met      PT LONG TERM GOAL #2   Title Pt will attain right shoulder flexion/abduction to at least 160 degrees with </= 1/10 pain to facilitate ability to reach overhead .    Status Partially Met      PT LONG TERM GOAL #3   Title Pt will report able to return to work with </= 1/10 pain in right shoulder    Status On-going      PT LONG TERM GOAL #4   Title Pt will achieve grip strength on the right within at least 5 lbs of the uninvolved upper extremity.    Status On-going      PT LONG TERM GOAL #5   Title Pt will be able to lift and push various weighted objects to facilitate return to work    Time 6    Period Weeks    Status New    Target Date 08/28/20                 Plan - 08/07/20 0807    Clinical Impression Statement Pt c/o some increased  achiness in the shoulder today but able to tolerate all exercises nicely. Reports grip is gradually improving but still unable to open a new jar independently. Alot of fatigue with closed chain wall scap stability exercises today. Continues to need some tactile cues for keeping shoulders down with lat pull downs and rows.    Personal Factors and Comorbidities Fitness;Comorbidity 2    Comorbidities type 2 diabetes, hypertension    Examination-Activity Limitations Bathing;Caring for Others;Carry;Dressing;Hygiene/Grooming;Lift;Reach Overhead    Examination-Participation Restrictions Occupation    Stability/Clinical Decision Making Evolving/Moderate complexity    Rehab Potential Good    PT Frequency 1x / week    PT Duration 6 weeks    PT Treatment/Interventions ADLs/Self Care Home Management;Cryotherapy;Electrical Stimulation;Moist Heat;Functional mobility training;Therapeutic activities;Therapeutic exercise;Neuromuscular re-education;Patient/family education;Manual techniques;Passive range of motion;Taping;Vasopneumatic Device    PT Next Visit Plan Reassess updated HEP and modify/advance as tolerated. Continue to work on increasing shoulder ROM, stability, RUE and grip strength. Gradually progress carrying/lifting and some closed chain stabilization, body mechanics as tolerated within next few sessions. Manual and modalities as needed.    Consulted and Agree with Plan of Care Patient           Patient will benefit from skilled therapeutic intervention in order to improve the following deficits and impairments:  Decreased range of motion,Increased fascial restricitons,Impaired UE functional use,Pain,Postural dysfunction,Decreased strength,Decreased mobility  Visit Diagnosis: Right shoulder pain, unspecified chronicity  Shoulder subluxation, right, subsequent encounter  Stiffness of right shoulder, not elsewhere classified  Muscle weakness (generalized)     Problem List Patient Active  Problem List   Diagnosis Date Noted  . Shoulder subluxation, right, subsequent encounter 04/12/2020  . Diabetes mellitus ([place]) 06/02/2017  . Gastroesophageal reflux disease without esophagitis 04/30/2017  . Essential hypertension 10/10/2015  . History of asthma 10/10/2015  . History of migraine headaches 10/10/2015  . Bipolar disorder, unspecified ([place]) 11/20/2011    Hall Busing, PT, DPT 08/07/2020, 8:43 AM  [place]. [place], [place], [address] Phone: [phone]   Fax:  [phone]  Name: Erasmus Bistline  Dault MRN: [id number] Date of Birth: 20-Nov-1980

## 2020-08-14 ENCOUNTER — Encounter: Payer: Self-pay | Admitting: Physical Therapy

## 2020-08-14 ENCOUNTER — Ambulatory Visit: Payer: Worker's Compensation | Admitting: Physical Therapy

## 2020-08-14 ENCOUNTER — Other Ambulatory Visit: Payer: Self-pay

## 2020-08-14 DIAGNOSIS — S43001D Unspecified subluxation of right shoulder joint, subsequent encounter: Secondary | ICD-10-CM

## 2020-08-14 DIAGNOSIS — M25511 Pain in right shoulder: Secondary | ICD-10-CM

## 2020-08-14 DIAGNOSIS — M6281 Muscle weakness (generalized): Secondary | ICD-10-CM

## 2020-08-14 DIAGNOSIS — M25611 Stiffness of right shoulder, not elsewhere classified: Secondary | ICD-10-CM

## 2020-08-14 NOTE — Therapy (Signed)
[place]. 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 Bipolar disease, chronic ([place])   . Diabetes mellitus without complication ([place])   . Hypertension   . Kidney stones   . Renal disorder     History reviewed. No pertinent surgical history.  There were no vitals filed for this visit.   Subjective Assessment - 08/14/20 0802    Subjective Doing all right, shoulder is a little stiff in the morning    Diagnostic tests per MD note MRI R shoulder 07/04/2020: MRI was revealing for only tendinosis and fraying of the labrum.  Did have some fluid within the biceps tendon is likely decompressing the glenohumeral joint.    Currently in Pain? No/denies              [facility] PT Assessment - 08/14/20 0001      AROM   Right Shoulder Flexion 169 Degrees    Right Shoulder ABduction 145 Degrees    Right Shoulder Internal Rotation 60 Degrees    Right Shoulder External Rotation 116 Degrees                         [facility] Adult PT Treatment/Exercise - 08/14/20 0001      Shoulder Exercises: Standing   External Rotation Strengthening;Right;20 reps;Weights    External Rotation Weight (lbs) 5    Internal Rotation 20 reps;Weights;Strengthening;Right    Internal Rotation Weight (lbs) 10    Flexion Weights;20 reps;Both;Strengthening     Shoulder Flexion Weight (lbs) 4    ABduction Strengthening;Both;20 reps;Weights    Shoulder ABduction Weight (lbs) 4    Extension Weights;Strengthening;Both;15 reps   x2   Extension Weight (lbs) 10    Other Standing Exercises AAROM flex, ext, IR WaTE bar 3lb    Other Standing Exercises 3lb ER abd to 90 2x10      Shoulder Exercises: ROM/Strengthening   Nustep L2 UE only x 4 min    Other ROM/Strengthening Exercises Rows & Lats 25# 2x10    Other ROM/Strengthening Exercises Chest press 15lb 2x10      Shoulder Exercises: Stretch   Other Shoulder Stretches Towel stretch 5x10''                    PT Short Term Goals - 07/17/20 0946      PT SHORT TERM GOAL #1   Title Pt will demonstrate independence with updated HEP    Time 2    Period Weeks    Status New    Target Date 07/31/20             PT Long Term Goals - 08/14/20 0833      PT LONG TERM GOAL #1  Title Pt will demonstrate gross RUE strength equivalent to LUE.    Status Partially Met      PT LONG TERM GOAL #2   Title Pt will attain right shoulder flexion/abduction to at least 160 degrees with </= 1/10 pain to facilitate ability to reach overhead .    Status Partially Met      PT LONG TERM GOAL #3   Title Pt will report able to return to work with </= 1/10 pain in right shoulder    Status On-going      PT LONG TERM GOAL #4   Title Pt will achieve grip strength on the right within at least 5 lbs of the uninvolved upper extremity.    Baseline Right= 45, Left = 120    Status On-going      PT LONG TERM GOAL #5   Title Pt will be able to lift and push various weighted objects to facilitate return to work    Status On-going                 Plan - 08/14/20 5056    Clinical Impression Statement Pt did well today's and has progressed towards goals. Pt has increased his R shoulder AROM in all directions. R grip remains weak compared to L. No reports of pain, increase resistance tolerated with most  interventions. Cues to keep shoulders down with seated rows.    Personal Factors and Comorbidities Fitness;Comorbidity 2    Comorbidities type 2 diabetes, hypertension    Examination-Activity Limitations Bathing;Caring for Others;Carry;Dressing;Hygiene/Grooming;Lift;Reach Overhead    Examination-Participation Restrictions Occupation    Stability/Clinical Decision Making Evolving/Moderate complexity    Rehab Potential Good    PT Frequency 1x / week    PT Duration 6 weeks    PT Treatment/Interventions ADLs/Self Care Home Management;Cryotherapy;Electrical Stimulation;Moist Heat;Functional mobility training;Therapeutic activities;Therapeutic exercise;Neuromuscular re-education;Patient/family education;Manual techniques;Passive range of motion;Taping;Vasopneumatic Device    PT Next Visit Plan Continue to work on increasing shoulder ROM, stability, RUE and grip strength. Gradually progress carrying/lifting and some closed chain stabilization, body mechanics as tolerated within next few sessions. Manual and modalities as needed.           Patient will benefit from skilled therapeutic intervention in order to improve the following deficits and impairments:  Decreased range of motion,Increased fascial restricitons,Impaired UE functional use,Pain,Postural dysfunction,Decreased strength,Decreased mobility  Visit Diagnosis: Right shoulder pain, unspecified chronicity  Stiffness of right shoulder, not elsewhere classified  Muscle weakness (generalized)  Shoulder subluxation, right, subsequent encounter     Problem List Patient Active Problem List   Diagnosis Date Noted  . Shoulder subluxation, right, subsequent encounter 04/12/2020  . Diabetes mellitus ([place]) 06/02/2017  . Gastroesophageal reflux disease without esophagitis 04/30/2017  . Essential hypertension 10/10/2015  . History of asthma 10/10/2015  . History of migraine headaches 10/10/2015  . Bipolar disorder, unspecified ([place])  11/20/2011    Scot Jun, PTA 08/14/2020, 8:40 AM  [place]. [place], [place], [address] Phone: [phone]   Fax:  [phone]  Name: Mark Lindsey MRN: [id number] Date of Birth: 31-May-1980

## 2020-08-20 ENCOUNTER — Encounter: Payer: Self-pay | Admitting: Family Medicine

## 2020-08-20 ENCOUNTER — Other Ambulatory Visit: Payer: Self-pay

## 2020-08-20 ENCOUNTER — Ambulatory Visit (INDEPENDENT_AMBULATORY_CARE_PROVIDER_SITE_OTHER): Payer: Worker's Compensation | Admitting: Family Medicine

## 2020-08-20 DIAGNOSIS — S43001D Unspecified subluxation of right shoulder joint, subsequent encounter: Secondary | ICD-10-CM

## 2020-08-20 NOTE — Progress Notes (Signed)
  Mark Lindsey - 40 y.o. male MRN [id number]  Date of birth: 1981/03/26  SUBJECTIVE:  Including CC & ROS.  No chief complaint on file.   Mark Lindsey is a 40 y.o. male that is following up for his right shoulder pain after an injury at work.  His pain has gotten improved.  It is only a slight ache currently.  He is slowly regaining his range of motion in his strength in physical therapy.   Review of Systems See HPI   HISTORY: Past Medical, Surgical, Social, and Family History Reviewed & Updated per EMR.   Pertinent Historical Findings include:  Past Medical History:  Diagnosis Date  . Bipolar disease, chronic ([facility])   . Diabetes mellitus without complication ([facility])   . Hypertension   . Kidney stones   . Renal disorder     History reviewed. No pertinent surgical history.  Family History  Problem Relation Age of Onset  . Diabetes Mother   . Hypertension Mother     Social History   Socioeconomic History  . Marital status: Married    Spouse name: Not on file  . Number of children: Not on file  . Years of education: Not on file  . Highest education level: Not on file  Occupational History  . Not on file  Tobacco Use  . Smoking status: Former Smoker    Types: Cigarettes  . Smokeless tobacco: Never Used  Substance and Sexual Activity  . Alcohol use: No  . Drug use: Not Currently  . Sexual activity: Not on file  Other Topics Concern  . Not on file  Social History Narrative  . Not on file   Social Determinants of Health   Financial Resource Strain: Not on file  Food Insecurity: Not on file  Transportation Needs: Not on file  Physical Activity: Not on file  Stress: Not on file  Social Connections: Not on file  Intimate Partner Violence: Not on file     PHYSICAL EXAM:  VS: BP (!) 134/96 (BP Location: Left Arm, Patient Position: Sitting, Cuff Size: Large)   Ht 6' (1.829 m)   Wt 270 lb (122.5 kg)   BMI 36.62 kg/m  Physical Exam Gen: NAD, alert,  cooperative with exam, well-appearing   ASSESSMENT & PLAN:   Shoulder subluxation, right, subsequent encounter Continues to get improvement with strength in his range of motion.  Pain is only a minor ache currently. -Counseled home exercise therapy and supportive care. -Continue physical therapy. -Work note. -Follow-up in 4 weeks.  Could consider injection

## 2020-08-20 NOTE — Assessment & Plan Note (Signed)
Continues to get improvement with strength in his range of motion.  Pain is only a minor ache currently. -Counseled home exercise therapy and supportive care. -Continue physical therapy. -Work note. -Follow-up in 4 weeks.  Could consider injection

## 2020-08-20 NOTE — Patient Instructions (Signed)
Good to see you Please continue with physical therapy  Please send me a message in MyChart with any questions or updates.  Please see me back in 4 weeks.   --Dr. Blong Busk  

## 2020-08-21 ENCOUNTER — Ambulatory Visit: Payer: Worker's Compensation

## 2020-08-28 ENCOUNTER — Ambulatory Visit: Payer: Worker's Compensation | Attending: Family Medicine | Admitting: Physical Therapy

## 2020-09-04 ENCOUNTER — Ambulatory Visit: Payer: Worker's Compensation | Admitting: Physical Therapy

## 2020-09-20 ENCOUNTER — Ambulatory Visit: Payer: 59 | Admitting: Family Medicine

## 2020-09-24 ENCOUNTER — Ambulatory Visit
Admission: EM | Admit: 2020-09-24 | Discharge: 2020-09-24 | Disposition: A | Discharge status: Home / Self Care | Payer: 59 | Attending: Emergency Medicine | Admitting: Emergency Medicine

## 2020-09-24 ENCOUNTER — Other Ambulatory Visit: Payer: Self-pay

## 2020-09-24 ENCOUNTER — Ambulatory Visit: Payer: 59 | Admitting: Family Medicine

## 2020-09-24 ENCOUNTER — Encounter: Payer: Self-pay | Admitting: Emergency Medicine

## 2020-09-24 ENCOUNTER — Encounter: Payer: Self-pay | Admitting: Family Medicine

## 2020-09-24 DIAGNOSIS — J069 Acute upper respiratory infection, unspecified: Secondary | ICD-10-CM

## 2020-09-24 DIAGNOSIS — Z1152 Encounter for screening for COVID-19: Secondary | ICD-10-CM

## 2020-09-24 NOTE — ED Provider Notes (Signed)
[facility]    CSN: [id number] Arrival date & time: 09/24/20  1051      History   Chief Complaint Chief Complaint  Patient presents with  . Nasal Congestion  . Sore Throat    HPI Mark Lindsey is a 40 y.o. male history of hypertension, DM type II, presenting today for evaluation of URI symptoms.  Reports associated sore throat and nasal congestion.  Reports multiple household members positive for COVID.  Son here with similar symptoms.  Denies fevers.  Denies difficulty breathing or shortness of breath.  Denies GI symptoms.  Has had body aches and generalized soreness.  HPI  Past Medical History:  Diagnosis Date  . Bipolar disease, chronic ([facility])   . Diabetes mellitus without complication ([facility])   . Hypertension   . Kidney stones   . Renal disorder     Patient Active Problem List   Diagnosis Date Noted  . Shoulder subluxation, right, subsequent encounter 04/12/2020  . Diabetes mellitus ([facility]) 06/02/2017  . Gastroesophageal reflux disease without esophagitis 04/30/2017  . Essential hypertension 10/10/2015  . History of asthma 10/10/2015  . History of migraine headaches 10/10/2015  . Bipolar disorder, unspecified ([facility]) 11/20/2011    History reviewed. No pertinent surgical history.     Home Medications    Prior to Admission medications   Medication Sig Start Date End Date Taking? Authorizing Provider  albuterol (VENTOLIN HFA) 108 (90 Base) MCG/ACT inhaler Inhale 2 puffs into the lungs every 6 (six) hours as needed for wheezing or shortness of breath. 03/04/20   Hall-Potvin, [place], PA-C  ALPRAZolam (XANAX) 0.25 MG tablet Xanax  1 tablet daily as needed    [provider]  azelastine (ASTELIN) 0.1 % nasal spray Place 2 sprays into both nostrils 2 (two) times daily. 07/04/19   Cathie Hoops, Amy V, PA-C  fluticasone (FLONASE) 50 MCG/ACT nasal spray Place 1-2 sprays into both nostrils daily. 06/27/20   Jozelyn Kuwahara C, PA-C  glimepiride (AMARYL) 2 MG  tablet TAKE 1 TABLET BY MOUTH 30 MINUTES BEFORE BREAKFAST. 03/21/18   [provider]  ibuprofen (ADVIL) 800 MG tablet Take 1 tablet (800 mg total) by mouth 3 (three) times daily. 04/10/20   Feleshia Zundel C, PA-C  insulin aspart (NOVOLOG) 100 UNIT/ML injection Inject 40 Units into the skin 2 (two) times daily.    [provider]  LamoTRIgine (LAMICTAL PO) Take by mouth.    [provider]  levocetirizine (XYZAL) 5 MG tablet Take 1 tablet (5 mg total) by mouth every evening. 10/13/19   Hall-Potvin, [place], PA-C  lisinopril (PRINIVIL,ZESTRIL) 20 MG tablet Take 20 mg by mouth daily.    [provider]  metFORMIN (GLUCOPHAGE) 500 MG tablet metformin  500 mg twice daily    [provider]  METFORMIN HCL PO Take by mouth.    [provider]  Omeprazole (PRILOSEC PO) Take by mouth.     [provider]  GLIPIZIDE PO Take by mouth.  02/04/20  [provider]    Family History Family History  Problem Relation Age of Onset  . Diabetes Mother   . Hypertension Mother     Social History Social History   Tobacco Use  . Smoking status: Former Smoker    Types: Cigarettes  . Smokeless tobacco: Never Used  Substance Use Topics  . Alcohol use: No  . Drug use: Not Currently     Allergies   Buspirone, Quetiapine fumarate, Septra [sulfamethoxazole-trimethoprim], Seroquel [quetiapine], and Sulfa antibiotics  Review of Systems Review of Systems  Constitutional: Positive for fatigue. Negative for activity change, appetite change, chills and fever.  HENT: Positive for congestion, rhinorrhea and sore throat. Negative for ear pain, sinus pressure and trouble swallowing.   Eyes: Negative for discharge and redness.  Respiratory: Negative for cough, chest tightness and shortness of breath.   Cardiovascular: Negative for chest pain.  Gastrointestinal: Negative for abdominal pain, diarrhea, nausea and vomiting.  Musculoskeletal:  Positive for myalgias.  Skin: Negative for rash.  Neurological: Positive for headaches. Negative for dizziness and light-headedness.     Physical Exam Triage Vital Signs ED Triage Vitals  Enc Vitals Group     BP      Pulse      Resp      Temp      Temp src      SpO2      Weight      Height      Head Circumference      Peak Flow      Pain Score      Pain Loc      Pain Edu?      Excl. in GC?    No data found.  Updated Vital Signs BP 128/90 (BP Location: Right Arm)   Pulse (!) 101   Temp 97.9 F (36.6 C) (Oral)   Resp 18   SpO2 96%   Visual Acuity Right Eye Distance:   Left Eye Distance:   Bilateral Distance:    Right Eye Near:   Left Eye Near:    Bilateral Near:     Physical Exam Vitals and nursing note reviewed.  Constitutional:      Appearance: He is well-developed.     Comments: No acute distress  HENT:     Head: Normocephalic and atraumatic.     Ears:     Comments: Bilateral ears without tenderness to palpation of external auricle, tragus and mastoid, EAC's without erythema or swelling, TM's with good bony landmarks and cone of light. Non erythematous.     Nose: Nose normal.     Mouth/Throat:     Comments: Oral mucosa pink and moist, no tonsillar enlargement or exudate. Posterior pharynx patent and nonerythematous, no uvula deviation or swelling. Normal phonation. Eyes:     Conjunctiva/sclera: Conjunctivae normal.  Cardiovascular:     Rate and Rhythm: Normal rate.  Pulmonary:     Effort: Pulmonary effort is normal. No respiratory distress.     Comments: Breathing comfortably at rest, CTABL, no wheezing, rales or other adventitious sounds auscultated Abdominal:     General: There is no distension.  Musculoskeletal:        General: Normal range of motion.     Cervical back: Neck supple.  Skin:    General: Skin is warm and dry.  Neurological:     Mental Status: He is alert and oriented to person, place, and time.      UC Treatments / Results   Labs (all labs ordered are listed, but only abnormal results are displayed) Labs Reviewed  NOVEL CORONAVIRUS, NAA    EKG   Radiology No results found.  Procedures Procedures (including critical care time)  Medications Ordered in UC Medications - No data to display  Initial Impression / Assessment and Plan / UC Course  I have reviewed the triage vital signs and the nursing notes.  Pertinent labs & imaging results that were available during my care of the patient were reviewed by me and considered  in my medical decision making (see chart for details).     Viral URI-COVID test pending, exam reassuring, low suspicion of COVID given household members positive, recommending symptomatic and supportive care with close monitoring.  Discussed strict return precautions. Patient verbalized understanding and is agreeable with plan.  Final Clinical Impressions(s) / UC Diagnoses   Final diagnoses:  Encounter for screening for COVID-19  Viral URI with cough     Discharge Instructions     COVID test pending  Tylenol and Ibuprofen As Needed for Headaches, Body Aches Rest and Fluids May Use Over-The-Counter Medicine As Needed for Cough and Congestion Return If Any Symptoms Not Improving or Worsening    ED Prescriptions    None     PDMP not reviewed this encounter.   Sharyon Cable [place] C, PA-C 09/24/20 1233

## 2020-09-24 NOTE — ED Triage Notes (Signed)
Pt here for nasal congestion and sore throat x 3 days; denies fever; pt sts family in home covid + sts neg home covid test on Saturday

## 2020-09-24 NOTE — Discharge Instructions (Signed)
COVID test pending  Tylenol and Ibuprofen As Needed for Headaches, Body Aches Rest and Fluids May Use Over-The-Counter Medicine As Needed for Cough and Congestion Return If Any Symptoms Not Improving or Worsening

## 2020-09-26 LAB — SARS-COV-2, NAA 2 DAY TAT

## 2020-09-26 LAB — NOVEL CORONAVIRUS, NAA: SARS-CoV-2, NAA: NOT DETECTED

## 2020-10-01 ENCOUNTER — Encounter: Payer: Self-pay | Admitting: Family Medicine

## 2020-10-01 ENCOUNTER — Other Ambulatory Visit: Payer: Self-pay

## 2020-10-01 ENCOUNTER — Ambulatory Visit (INDEPENDENT_AMBULATORY_CARE_PROVIDER_SITE_OTHER): Payer: Worker's Compensation | Admitting: Family Medicine

## 2020-10-01 VITALS — BP 148/96 | Ht 72.0 in | Wt 260.0 lb

## 2020-10-01 DIAGNOSIS — S43001D Unspecified subluxation of right shoulder joint, subsequent encounter: Secondary | ICD-10-CM | POA: Diagnosis not present

## 2020-10-01 NOTE — Patient Instructions (Signed)
Good to see you Keep me updated about physical therapy   Please send me a message in MyChart with any questions or updates.  Please see me back in 4 weeks.   --Dr. Jordan Likes

## 2020-10-01 NOTE — Assessment & Plan Note (Signed)
Pain is improved but still lacks strength after his injury at work.  He has completed physical therapy -Counseled on home exercise therapy and supportive care. -Could consider an FCE. -Consider injection

## 2020-10-01 NOTE — Progress Notes (Signed)
  Mark Lindsey - 40 y.o. male MRN [id number]  Date of birth: 05/29/1980  SUBJECTIVE:  Including CC & ROS.  No chief complaint on file.   Mark Lindsey is a 40 y.o. male that is following up for his right shoulder pain after an injury at work.  He has completed physical therapy.  He still has lack of strength.  His range of motion has returned for the most part.   Review of Systems See HPI   HISTORY: Past Medical, Surgical, Social, and Family History Reviewed & Updated per EMR.   Pertinent Historical Findings include:  Past Medical History:  Diagnosis Date   Bipolar disease, chronic (HCC)    Diabetes mellitus without complication (HCC)    Hypertension    Kidney stones    Renal disorder     History reviewed. No pertinent surgical history.  Family History  Problem Relation Age of Onset   Diabetes Mother    Hypertension Mother     Social History   Socioeconomic History   Marital status: Married    Spouse name: Not on file   Number of children: Not on file   Years of education: Not on file   Highest education level: Not on file  Occupational History   Not on file  Tobacco Use   Smoking status: Former    Pack years: 0.00    Types: Cigarettes   Smokeless tobacco: Never  Substance and Sexual Activity   Alcohol use: No   Drug use: Not Currently   Sexual activity: Not on file  Other Topics Concern   Not on file  Social History Narrative   Not on file   Social Determinants of Health   Financial Resource Strain: Not on file  Food Insecurity: Not on file  Transportation Needs: Not on file  Physical Activity: Not on file  Stress: Not on file  Social Connections: Not on file  Intimate Partner Violence: Not on file     PHYSICAL EXAM:  VS: BP (!) 148/96 (BP Location: Left Arm, Patient Position: Sitting, Cuff Size: Large)   Ht 6' (1.829 m)   Wt 260 lb (117.9 kg)   BMI 35.26 kg/m  Physical Exam Gen: NAD, alert, cooperative with exam,  well-appearing      ASSESSMENT & PLAN:   Shoulder subluxation, right, subsequent encounter Pain is improved but still lacks strength after his injury at work.  He has completed physical therapy -Counseled on home exercise therapy and supportive care. -Could consider an FCE. -Consider injection

## 2020-10-31 ENCOUNTER — Ambulatory Visit: Payer: 59 | Admitting: Family Medicine

## 2020-10-31 NOTE — Progress Notes (Deleted)
  Mark Lindsey - 40 y.o. male MRN [id number]  Date of birth: 17-May-1980  SUBJECTIVE:  Including CC & ROS.  No chief complaint on file.   Mark Lindsey is a 40 y.o. male that is  ***.  ***   Review of Systems See HPI   HISTORY: Past Medical, Surgical, Social, and Family History Reviewed & Updated per EMR.   Pertinent Historical Findings include:  Past Medical History:  Diagnosis Date  . Bipolar disease, chronic (HCC)   . Diabetes mellitus without complication (HCC)   . Hypertension   . Kidney stones   . Renal disorder     No past surgical history on file.  Family History  Problem Relation Age of Onset  . Diabetes Mother   . Hypertension Mother     Social History   Socioeconomic History  . Marital status: Married    Spouse name: Not on file  . Number of children: Not on file  . Years of education: Not on file  . Highest education level: Not on file  Occupational History  . Not on file  Tobacco Use  . Smoking status: Former    Pack years: 0.00    Types: Cigarettes  . Smokeless tobacco: Never  Substance and Sexual Activity  . Alcohol use: No  . Drug use: Not Currently  . Sexual activity: Not on file  Other Topics Concern  . Not on file  Social History Narrative  . Not on file   Social Determinants of Health   Financial Resource Strain: Not on file  Food Insecurity: Not on file  Transportation Needs: Not on file  Physical Activity: Not on file  Stress: Not on file  Social Connections: Not on file  Intimate Partner Violence: Not on file     PHYSICAL EXAM:  VS: There were no vitals taken for this visit. Physical Exam Gen: NAD, alert, cooperative with exam, well-appearing MSK:  ***      ASSESSMENT & PLAN:   No problem-specific Assessment & Plan notes found for this encounter.

## 2020-11-08 ENCOUNTER — Other Ambulatory Visit: Payer: Self-pay

## 2020-11-08 ENCOUNTER — Ambulatory Visit (INDEPENDENT_AMBULATORY_CARE_PROVIDER_SITE_OTHER): Payer: Worker's Compensation | Admitting: Family Medicine

## 2020-11-08 ENCOUNTER — Encounter: Payer: Self-pay | Admitting: Family Medicine

## 2020-11-08 VITALS — BP 140/82 | Ht 72.0 in | Wt 260.0 lb

## 2020-11-08 DIAGNOSIS — S43001D Unspecified subluxation of right shoulder joint, subsequent encounter: Secondary | ICD-10-CM

## 2020-11-08 NOTE — Assessment & Plan Note (Signed)
Injury occurred at work.  Initial injury was in December. -Counseled on home exercise therapy and supportive care. -Referral for functional capacity exam.

## 2020-11-08 NOTE — Patient Instructions (Signed)
Good to see you We'll send an order for a functional capacity exam with physical therapy  Please send me a message in MyChart with any questions or updates.  We'll setup a virtual visit once the result is in.   --Dr. Jordan Likes

## 2020-11-08 NOTE — Progress Notes (Signed)
  Mark Lindsey - 40 y.o. male MRN [id number]  Date of birth: [birth year]  SUBJECTIVE:  Including CC & ROS.  No chief complaint on file.   Mark Lindsey is a 40 y.o. male that is following up for his right shoulder pain after an injury at work.  His symptoms are intermittent and mild in nature.   Review of Systems See HPI   HISTORY: Past Medical, Surgical, Social, and Family History Reviewed & Updated per EMR.   Pertinent Historical Findings include:  Past Medical History:  Diagnosis Date   Bipolar disease, chronic (HCC)    Diabetes mellitus without complication (HCC)    Hypertension    Kidney stones    Renal disorder     History reviewed. No pertinent surgical history.  Family History  Problem Relation Age of Onset   Diabetes Mother    Hypertension Mother     Social History   Socioeconomic History   Marital status: Married    Spouse name: Not on file   Number of children: Not on file   Years of education: Not on file   Highest education level: Not on file  Occupational History   Not on file  Tobacco Use   Smoking status: Former    Types: Cigarettes   Smokeless tobacco: Never  Substance and Sexual Activity   Alcohol use: No   Drug use: Not Currently   Sexual activity: Not on file  Other Topics Concern   Not on file  Social History Narrative   Not on file   Social Determinants of Health   Financial Resource Strain: Not on file  Food Insecurity: Not on file  Transportation Needs: Not on file  Physical Activity: Not on file  Stress: Not on file  Social Connections: Not on file  Intimate Partner Violence: Not on file     PHYSICAL EXAM:  VS: BP 140/82 (BP Location: Left Arm, Patient Position: Sitting, Cuff Size: Large)   Ht 6' (1.829 m)   Wt 260 lb (117.9 kg)   BMI 35.26 kg/m  Physical Exam Gen: NAD, alert, cooperative with exam, well-appearing       ASSESSMENT & PLAN:   Shoulder subluxation, right, subsequent encounter Injury occurred  at work.  Initial injury was in December. -Counseled on home exercise therapy and supportive care. -Referral for functional capacity exam.

## 2021-01-28 ENCOUNTER — Ambulatory Visit (INDEPENDENT_AMBULATORY_CARE_PROVIDER_SITE_OTHER): Payer: 59

## 2021-01-28 ENCOUNTER — Encounter: Payer: Self-pay | Admitting: Emergency Medicine

## 2021-01-28 ENCOUNTER — Ambulatory Visit
Admission: EM | Admit: 2021-01-28 | Discharge: 2021-01-28 | Disposition: A | Discharge status: Home / Self Care | Payer: 59 | Attending: Physician Assistant | Admitting: Physician Assistant

## 2021-01-28 ENCOUNTER — Other Ambulatory Visit: Payer: Self-pay

## 2021-01-28 DIAGNOSIS — W19XXXA Unspecified fall, initial encounter: Secondary | ICD-10-CM | POA: Diagnosis not present

## 2021-01-28 DIAGNOSIS — M79641 Pain in right hand: Secondary | ICD-10-CM

## 2021-01-28 DIAGNOSIS — S6991XA Unspecified injury of right wrist, hand and finger(s), initial encounter: Secondary | ICD-10-CM

## 2021-01-28 MED ORDER — NAPROXEN 500 MG PO TABS: 500.0000 mg | ORAL_TABLET | Freq: Two times a day (BID) | ORAL | 0 refills | Status: DC

## 2021-01-28 NOTE — ED Notes (Signed)
Ice pack applied to injury.

## 2021-01-28 NOTE — ED Provider Notes (Signed)
[facility]    CSN: [id number] Arrival date & time: 01/28/21  4680      History   Chief Complaint Chief Complaint  Patient presents with   Fall    HPI Mark Lindsey is a 40 y.o. male.   Patient here today for evaluation of right hand injury that occurred earlier this morning.  He reports that his second step broke and when it did he fell onto his right hand as it was hyperflexed.  He has had pain around his second and third MCP joints since that time, he states he is having some difficulty moving his fingers as a result.  He reports some mild right wrist pain but nothing compared to his hand pain.  He has tried ibuprofen and Tylenol without significant relief.  He denies any numbness or tingling.  He is using ice in office to help with swelling.  The history is provided by the patient.  Fall Pertinent negatives include no shortness of breath.   Past Medical History:  Diagnosis Date   Bipolar disease, chronic (HCC)    Diabetes mellitus without complication (HCC)    Hypertension    Kidney stones    Renal disorder     Patient Active Problem List   Diagnosis Date Noted   Shoulder subluxation, right, subsequent encounter 04/12/2020   Diabetes mellitus (HCC) 06/02/2017   Gastroesophageal reflux disease without esophagitis 04/30/2017   Essential hypertension 10/10/2015   History of asthma 10/10/2015   History of migraine headaches 10/10/2015   Bipolar disorder, unspecified (HCC) 11/20/2011    History reviewed. No pertinent surgical history.     Home Medications    Prior to Admission medications   Medication Sig Start Date End Date Taking? Authorizing Provider  naproxen (NAPROSYN) 500 MG tablet Take 1 tablet (500 mg total) by mouth 2 (two) times daily. 01/28/21  Yes Tomi Bamberger, PA-C  albuterol (VENTOLIN HFA) 108 (90 Base) MCG/ACT inhaler Inhale 2 puffs into the lungs every 6 (six) hours as needed for wheezing or shortness of breath. 03/04/20    Hall-Potvin, [place], PA-C  ALPRAZolam (XANAX) 0.25 MG tablet Xanax  1 tablet daily as needed    [provider]  azelastine (ASTELIN) 0.1 % nasal spray Place 2 sprays into both nostrils 2 (two) times daily. 07/04/19   Cathie Hoops, Amy V, PA-C  fluticasone (FLONASE) 50 MCG/ACT nasal spray Place 1-2 sprays into both nostrils daily. 06/27/20   Wieters, Hallie C, PA-C  glimepiride (AMARYL) 2 MG tablet TAKE 1 TABLET BY MOUTH 30 MINUTES BEFORE BREAKFAST. 03/21/18   [provider]  insulin aspart (NOVOLOG) 100 UNIT/ML injection Inject 40 Units into the skin 2 (two) times daily.    [provider]  LamoTRIgine (LAMICTAL PO) Take by mouth.    [provider]  levocetirizine (XYZAL) 5 MG tablet Take 1 tablet (5 mg total) by mouth every evening. 10/13/19   Hall-Potvin, [place], PA-C  lisinopril (PRINIVIL,ZESTRIL) 20 MG tablet Take 20 mg by mouth daily.    [provider]  metFORMIN (GLUCOPHAGE) 500 MG tablet metformin  500 mg twice daily    [provider]  METFORMIN HCL PO Take by mouth.    [provider]  Omeprazole (PRILOSEC PO) Take by mouth.     [provider]  GLIPIZIDE PO Take by mouth.  02/04/20  [provider]    Family History Family History  Problem Relation Age of Onset   Diabetes Mother    Hypertension Mother  Social History Social History   Tobacco Use   Smoking status: Former    Types: Cigarettes   Smokeless tobacco: Never  Substance Use Topics   Alcohol use: No   Drug use: Not Currently     Allergies   Buspirone, Quetiapine fumarate, Septra [sulfamethoxazole-trimethoprim], Seroquel [quetiapine], and Sulfa antibiotics   Review of Systems Review of Systems  Constitutional:  Negative for fever.  Eyes:  Negative for discharge and redness.  Respiratory:  Negative for shortness of breath.   Gastrointestinal:  Negative for nausea and vomiting.  Musculoskeletal:  Positive for arthralgias and joint  swelling.  Neurological:  Negative for numbness.    Physical Exam Triage Vital Signs ED Triage Vitals [01/28/21 0828]  Enc Vitals Group     BP (!) 142/94     Pulse Rate (!) 101     Resp 16     Temp 98.1 F (36.7 C)     Temp Source Oral     SpO2 96 %     Weight      Height      Head Circumference      Peak Flow      Pain Score 7     Pain Loc      Pain Edu?      Excl. in GC?    No data found.  Updated Vital Signs BP (!) 142/94 (BP Location: Left Arm)   Pulse (!) 101   Temp 98.1 F (36.7 C) (Oral)   Resp 16   SpO2 96%     Physical Exam Vitals and nursing note reviewed.  Constitutional:      General: He is not in acute distress.    Appearance: Normal appearance. He is not ill-appearing.  HENT:     Head: Normocephalic and atraumatic.  Eyes:     Conjunctiva/sclera: Conjunctivae normal.  Cardiovascular:     Rate and Rhythm: Normal rate.  Pulmonary:     Effort: Pulmonary effort is normal.  Musculoskeletal:     Comments: Decreased ROM of all right fingers due to pain, swelling noted at 2nd, 3rd MCP with small superficial abrasion noted to same area.   Skin:    General: Skin is warm and dry.     Capillary Refill: Normal cap refill to right distal fingers.  Neurological:     Mental Status: He is alert.     Comments: Gross sensation intact to right fingers distally     UC Treatments / Results  Labs (all labs ordered are listed, but only abnormal results are displayed) Labs Reviewed - No data to display  EKG   Radiology DG Hand Complete Right  Result Date: 01/28/2021 CLINICAL DATA:  40 year old male status post fall with pain and swelling. EXAM: RIGHT HAND - COMPLETE 3+ VIEW COMPARISON:  None. FINDINGS: Bone mineralization is within normal limits. There is no evidence of fracture or dislocation. There is no evidence of arthropathy or other focal bone abnormality. No discrete soft tissue injury. IMPRESSION: Negative. Electronically Signed   By: Odessa Fleming M.D.    On: 01/28/2021 08:47    Procedures Procedures (including critical care time)  Medications Ordered in UC Medications - No data to display  Initial Impression / Assessment and Plan / UC Course  I have reviewed the triage vital signs and the nursing notes.  Pertinent labs & imaging results that were available during my care of the patient were reviewed by me and considered in my medical decision making (see chart for details).  X-ray without findings concerning for fracture, patient requests he have hand wrapped for work, and Ace bandage applied in office.  Recommended further evaluation with Ortho if symptoms persist given significant decreased range of motion.  Discussed x-ray would not rule out soft tissue damage, and he may need further imaging if symptoms continue.  Patient expresses understanding.  Naproxen prescribed for pain and antiinformation, and discussed using Tylenol as well if needed for pain relief.  Final Clinical Impressions(s) / UC Diagnoses   Final diagnoses:  Injury of right hand, initial encounter  Fall, initial encounter     Discharge Instructions      Follow up with ortho as needed. Take naproxen twice daily. Ok to use tylenol as well for pain relief if needed. Follow up with any further concerns.      ED Prescriptions     Medication Sig Dispense Auth. Provider   naproxen (NAPROSYN) 500 MG tablet Take 1 tablet (500 mg total) by mouth 2 (two) times daily. 30 tablet Tomi Bamberger, PA-C      PDMP not reviewed this encounter.   Tomi Bamberger, PA-C 01/28/21 [phone]

## 2021-01-28 NOTE — ED Triage Notes (Signed)
Wooden step broke, patient fell onto back of right hand/wrist. Mild swelling/bruising to posterior right hand and wrist, pain at rest and with movement.

## 2021-01-28 NOTE — Discharge Instructions (Addendum)
Follow up with ortho as needed. Take naproxen twice daily. Ok to use tylenol as well for pain relief if needed. Follow up with any further concerns.

## 2021-02-07 DIAGNOSIS — E1165 Type 2 diabetes mellitus with hyperglycemia: Secondary | ICD-10-CM | POA: Insufficient documentation

## 2021-05-20 ENCOUNTER — Telehealth: Payer: Self-pay | Admitting: Family Medicine

## 2021-05-20 NOTE — Telephone Encounter (Signed)
Nurse Case Mgr/ Ray @ [facility] [phone] called to scheduled a FCE results review appt for pt --she will contact pt to inform him of Appt date & time --fyi  --glh

## 2021-05-27 ENCOUNTER — Ambulatory Visit (INDEPENDENT_AMBULATORY_CARE_PROVIDER_SITE_OTHER): Payer: Worker's Compensation | Admitting: Family Medicine

## 2021-05-27 VITALS — BP 124/84 | Ht 72.0 in | Wt 253.0 lb

## 2021-05-27 DIAGNOSIS — S43001D Unspecified subluxation of right shoulder joint, subsequent encounter: Secondary | ICD-10-CM | POA: Diagnosis not present

## 2021-05-27 NOTE — Patient Instructions (Signed)
Good to see you  Please send me a message in MyChart with any questions or updates.  Please see me back as needed.   --Dr. Michale Weikel  

## 2021-05-27 NOTE — Progress Notes (Signed)
°  GERREN HOFFMEIER - 41 y.o. male MRN [id number]  Date of birth: Sep 15, 1980  SUBJECTIVE:  Including CC & ROS.  No chief complaint on file.   DADRIAN BALLANTINE is a 41 y.o. male that is following up after his functional capacity exam.  This was demonstrating a level of medium physical demand.  He denies any pain in his shoulder.   Review of Systems See HPI   HISTORY: Past Medical, Surgical, Social, and Family History Reviewed & Updated per EMR.   Pertinent Historical Findings include:  Past Medical History:  Diagnosis Date   Bipolar disease, chronic (HCC)    Diabetes mellitus without complication (HCC)    Hypertension    Kidney stones    Renal disorder     No past surgical history on file.   PHYSICAL EXAM:  VS: BP 124/84    Ht 6' (1.829 m)    Wt 253 lb (114.8 kg)    BMI 34.31 kg/m  Physical Exam Gen: NAD, alert, cooperative with exam, well-appearing MSK:  Neurovascularly intact       ASSESSMENT & PLAN:   Shoulder subluxation, right, subsequent encounter Initial injury occurred at work.  Had functional capacity exam with limitations.  Provided work note reaching maximal medical improvement. -Counseled on home exercise therapy and supportive care.

## 2021-05-27 NOTE — Assessment & Plan Note (Signed)
Initial injury occurred at work.  Had functional capacity exam with limitations.  Provided work note reaching maximal medical improvement. -Counseled on home exercise therapy and supportive care.

## 2021-05-28 ENCOUNTER — Telehealth: Payer: Self-pay | Admitting: Family Medicine

## 2021-05-28 NOTE — Telephone Encounter (Signed)
Faxing LOV notes for 05/27/21 to [facility] nurse case mgr / Gray Bernhardt (ph#)along with Work note to fax# [phone]  --glh

## 2021-08-12 ENCOUNTER — Ambulatory Visit
Admission: EM | Admit: 2021-08-12 | Discharge: 2021-08-12 | Disposition: A | Discharge status: Home / Self Care | Payer: 59 | Attending: Urgent Care | Admitting: Urgent Care

## 2021-08-12 ENCOUNTER — Ambulatory Visit (INDEPENDENT_AMBULATORY_CARE_PROVIDER_SITE_OTHER): Payer: 59

## 2021-08-12 DIAGNOSIS — M542 Cervicalgia: Secondary | ICD-10-CM

## 2021-08-12 DIAGNOSIS — E119 Type 2 diabetes mellitus without complications: Secondary | ICD-10-CM | POA: Diagnosis not present

## 2021-08-12 DIAGNOSIS — M5030 Other cervical disc degeneration, unspecified cervical region: Secondary | ICD-10-CM

## 2021-08-12 DIAGNOSIS — M62838 Other muscle spasm: Secondary | ICD-10-CM

## 2021-08-12 DIAGNOSIS — Z794 Long term (current) use of insulin: Secondary | ICD-10-CM

## 2021-08-12 MED ORDER — TIZANIDINE HCL 4 MG PO TABS: 4.0000 mg | ORAL_TABLET | Freq: Every day | ORAL | 0 refills | Status: AC

## 2021-08-12 MED ORDER — KETOROLAC TROMETHAMINE 60 MG/2ML IM SOLN
60.0000 mg | Freq: Once | INTRAMUSCULAR | Status: AC
  Administered 2021-08-12: 60 mg via INTRAMUSCULAR

## 2021-08-12 MED ORDER — NAPROXEN 500 MG PO TABS: 500.0000 mg | ORAL_TABLET | Freq: Two times a day (BID) | ORAL | 0 refills | Status: DC

## 2021-08-12 NOTE — ED Triage Notes (Signed)
1-45mo h/o intermittent axial neck and bilateral trap pain. Pt reports today when he turned his head to look the pain increased significantly. ?No pain with flexion and extension. Has been taking tylenol and ibuprofen. No n/t in BUE. ?

## 2021-08-12 NOTE — ED Provider Notes (Addendum)
?[facility] ? ? ?MRN: [id number] DOB: [birth year] ? ?Subjective:  ? ?Mark Lindsey is a 40 y.o. male presenting for 1-2 month history of persistent intermittent bilateral neck pain, midline pain along his neck with radiation to either trapezius.  No fall, trauma, weakness, numbness or tingling, radicular symptoms.  Patient used to do a lot of heavy lifting for his work.  He is also had some whiplash he used to play football.  Has been using Tylenol and ibuprofen with some relief. ? ?No current facility-administered medications for this encounter. ? ?Current Outpatient Medications:  ?  ALPRAZolam (XANAX) 0.25 MG tablet, Xanax  1 tablet daily as needed, Disp: , Rfl:  ?  glimepiride (AMARYL) 2 MG tablet, TAKE 1 TABLET BY MOUTH 30 MINUTES BEFORE BREAKFAST., Disp: , Rfl:  ?  insulin aspart (NOVOLOG) 100 UNIT/ML injection, Inject 40 Units into the skin 2 (two) times daily., Disp: , Rfl:  ?  LamoTRIgine (LAMICTAL PO), Take by mouth., Disp: , Rfl:  ?  lisinopril (PRINIVIL,ZESTRIL) 20 MG tablet, Take 20 mg by mouth daily., Disp: , Rfl:  ?  metFORMIN (GLUCOPHAGE) 500 MG tablet, metformin  500 mg twice daily, Disp: , Rfl:  ?  naproxen (NAPROSYN) 500 MG tablet, Take 1 tablet (500 mg total) by mouth 2 (two) times daily., Disp: 30 tablet, Rfl: 0  ? ?Allergies  ?Allergen Reactions  ? Buspirone Other (See Comments) and Palpitations  ?  Headaches ?  ? Quetiapine Fumarate Other (See Comments)  ? Septra [Sulfamethoxazole-Trimethoprim]   ? Seroquel [Quetiapine]   ?  Pt stated, "Gives me heart palpitations"  ? Sulfa Antibiotics   ? ? ?Past Medical History:  ?Diagnosis Date  ? Bipolar disease, chronic (HCC)   ? Diabetes mellitus without complication (HCC)   ? Hypertension   ? Kidney stones   ? Renal disorder   ?  ? ?History reviewed. No pertinent surgical history. ? ?Family History  ?Problem Relation Age of Onset  ? Diabetes Mother   ? Hypertension Mother   ? ? ?Social History  ? ?Tobacco Use  ? Smoking status: Former  ?   Types: Cigarettes  ? Smokeless tobacco: Never  ?Substance Use Topics  ? Alcohol use: No  ? Drug use: Not Currently  ? ? ?ROS ? ? ?Objective:  ? ?Vitals: ?BP 132/84 (BP Location: Left Arm)   Pulse 89   Temp 97.8 ?F (36.6 ?C) (Oral)   Resp 18   SpO2 98%  ? ?Physical Exam ?Constitutional:   ?   General: He is not in acute distress. ?   Appearance: Normal appearance. He is well-developed and normal weight. He is not ill-appearing, toxic-appearing or diaphoretic.  ?HENT:  ?   Head: Normocephalic and atraumatic.  ?   Right Ear: External ear normal.  ?   Left Ear: External ear normal.  ?   Nose: Nose normal.  ?   Mouth/Throat:  ?   Pharynx: Oropharynx is clear.  ?Eyes:  ?   General: No scleral icterus.    ?   Right eye: No discharge.     ?   Left eye: No discharge.  ?   Extraocular Movements: Extraocular movements intact.  ?Cardiovascular:  ?   Rate and Rhythm: Normal rate.  ?Pulmonary:  ?   Effort: Pulmonary effort is normal.  ?Musculoskeletal:  ?   Cervical back: Normal range of motion. Spasms, tenderness and bony tenderness (midline) present. No swelling, edema, deformity, erythema, signs of trauma, lacerations, rigidity, torticollis or  crepitus. Pain with movement present. Normal range of motion.  ?   Comments: Positive Spurling sign, negative Lhermitte sign.  ?Neurological:  ?   Mental Status: He is alert and oriented to person, place, and time.  ?Psychiatric:     ?   Mood and Affect: Mood normal.     ?   Behavior: Behavior normal.     ?   Thought Content: Thought content normal.     ?   Judgment: Judgment normal.  ? ? ?DG Cervical Spine Complete ? ?Result Date: 08/12/2021 ?CLINICAL DATA:  Neck pain EXAM: CERVICAL SPINE - COMPLETE 4+ VIEW COMPARISON:  None. FINDINGS: Mild reversal of cervical lordosis. Suboptimal visualization of C7 and cervicothoracic junction. Normal prevertebral soft tissue thickness. Mild disc space narrowing and degenerative change C4-C5 and C5-C6. No high-grade foraminal narrowing. The dens  and lateral masses are within normal limits. IMPRESSION: Mild reversal of cervical lordosis with mild degenerative changes Electronically Signed   By: Jasmine Pang M.D.   On: 08/12/2021 18:25   ? ?IM Toradol in clinic at 60 mg. ? ?Assessment and Plan :  ? ?PDMP not reviewed this encounter. ? ?1. Degenerative disc disease, cervical   ?2. Neck pain   ?3. Trapezius muscle spasm   ?4. Type 2 diabetes mellitus treated with insulin (HCC)   ? ?Patient is severely uncontrolled diabetic treated with insulin.  Therefore have to avoid steroids.  Recommend naproxen for pain and inflammation, tizanidine as a muscle relaxant.  Follow-up with neuro spine specialty clinic.  Counseled patient on potential for adverse effects with medications prescribed/recommended today, ER and return-to-clinic precautions discussed, patient verbalized understanding. ? ?  ?  ?Wallis Bamberg, PA-C ?08/12/21 1828 ? ?

## 2021-12-10 ENCOUNTER — Telehealth: Payer: 59 | Admitting: Physician Assistant

## 2021-12-10 DIAGNOSIS — K047 Periapical abscess without sinus: Secondary | ICD-10-CM

## 2021-12-10 MED ORDER — IBUPROFEN 600 MG PO TABS: 600.0000 mg | ORAL_TABLET | Freq: Three times a day (TID) | ORAL | 0 refills | Status: AC | PRN

## 2021-12-10 MED ORDER — AMOXICILLIN 500 MG PO CAPS: 500.0000 mg | ORAL_CAPSULE | Freq: Three times a day (TID) | ORAL | 0 refills | Status: AC

## 2021-12-10 NOTE — Progress Notes (Signed)

## 2021-12-10 NOTE — Progress Notes (Signed)
I have spent 5 minutes in review of e-visit questionnaire, review and updating patient chart, medical decision making and response to patient.   Giannis Corpuz Cody Katelynne Revak, PA-C    

## 2022-01-07 ENCOUNTER — Telehealth: Payer: 59 | Admitting: Nurse Practitioner

## 2022-01-07 DIAGNOSIS — J069 Acute upper respiratory infection, unspecified: Secondary | ICD-10-CM

## 2022-01-07 MED ORDER — FLUTICASONE PROPIONATE 50 MCG/ACT NA SUSP: 2.0000 | Freq: Every day | NASAL | 6 refills | Status: DC

## 2022-01-07 MED ORDER — ALBUTEROL SULFATE HFA 108 (90 BASE) MCG/ACT IN AERS: 2.0000 | INHALATION_SPRAY | Freq: Four times a day (QID) | RESPIRATORY_TRACT | 0 refills | Status: DC | PRN

## 2022-01-07 MED ORDER — BENZONATATE 100 MG PO CAPS: 100.0000 mg | ORAL_CAPSULE | Freq: Three times a day (TID) | ORAL | 0 refills | Status: DC | PRN

## 2022-01-07 NOTE — Progress Notes (Signed)
E-Visit for Sinus Problems  We are sorry that you are not feeling well.  Here is how we plan to help!  Based on what you have shared with me it looks like you have sinusitis.  Sinusitis is inflammation and infection in the sinus cavities of the head.  Based on your presentation I believe you most likely have Acute Viral Sinusitis.This is an infection most likely caused by a virus. There is not specific treatment for viral sinusitis other than to help you with the symptoms until the infection runs its course.  You may use an oral decongestant such as Mucinex D or if you have glaucoma or high blood pressure use plain Mucinex. Saline nasal spray help and can safely be used as often as needed for congestion, I have prescribed: Fluticasone nasal spray two sprays in each nostril once a day We will also prescribe something for cough and refill your inhaler to assure you can manage your asthma through the course of your virus.   Meds ordered this encounter  Medications   albuterol (VENTOLIN HFA) 108 (90 Base) MCG/ACT inhaler    Sig: Inhale 2 puffs into the lungs every 6 (six) hours as needed for wheezing or shortness of breath.    Dispense:  8 g    Refill:  0   benzonatate (TESSALON) 100 MG capsule    Sig: Take 1 capsule (100 mg total) by mouth 3 (three) times daily as needed.    Dispense:  30 capsule    Refill:  0   fluticasone (FLONASE) 50 MCG/ACT nasal spray    Sig: Place 2 sprays into both nostrils daily.    Dispense:  16 g    Refill:  6     Some authorities believe that zinc sprays or the use of Echinacea may shorten the course of your symptoms.  Sinus infections are not as easily transmitted as other respiratory infection, however we still recommend that you avoid close contact with loved ones, especially the very young and elderly.  Remember to wash your hands thoroughly throughout the day as this is the number one way to prevent the spread of infection!  Home Care: Only take medications  as instructed by your medical team. Do not take these medications with alcohol. A steam or ultrasonic humidifier can help congestion.  You can place a towel over your head and breathe in the steam from hot water coming from a faucet. Avoid close contacts especially the very young and the elderly. Cover your mouth when you cough or sneeze. Always remember to wash your hands.  Get Help Right Away If: You develop worsening fever or sinus pain. You develop a severe head ache or visual changes. Your symptoms persist after you have completed your treatment plan.  Make sure you Understand these instructions. Will watch your condition. Will get help right away if you are not doing well or get worse.   Thank you for choosing an e-visit.  Your e-visit answers were reviewed by a board certified advanced clinical practitioner to complete your personal care plan. Depending upon the condition, your plan could have included both over the counter or prescription medications.  Please review your pharmacy choice. Make sure the pharmacy is open so you can pick up prescription now. If there is a problem, you may contact your provider through [employer] and have the prescription routed to another pharmacy.  Your safety is important to [place]. If you have drug allergies check your prescription carefully.  For the next 24 hours you can use MyChart to ask questions about today's visit, request a non-urgent call back, or ask for a work or school excuse. You will get an email in the next two days asking about your experience. I hope that your e-visit has been valuable and will speed your recovery.  I spent approximately 5 minutes reviewing the patient's history, current symptoms and coordinating their plan of care today.

## 2022-04-07 NOTE — Progress Notes (Signed)
Subjective:    Mark Lindsey - 41 y.o. male MRN [id number]  Date of birth: 08-Jul-1980  HPI  Mark Lindsey is to establish care.   Current issues and/or concerns: - Reports has not seen a primary care provider in over 1 year.  - Diabetic patient. Taking Metformin 1000 mg twice daily as prescribed, no issues/concerns. In the past he was taking Glimepiride. States he was then switched from Glimepiride to Novolin 70/30. Subsequently he was switched from Novolin 70/30 to [place] 15 units daily but hasn't taken in at least 90 days due to needing an appointment with his previous primary care provider for refills and health insurance coverage concerns. Reports he was never given an explanation as to why he was switched from Glimepiride, to Novolin 70/30, to [place]. Confirms he never had any side effects from any of the previous diabetic medications he was taking. Reports sometimes he "doesn't feel like taking medications" and will go a while without taking. States when he resumes the regimen he may have upset stomach for a while but subsides quickly. He doesn't monitor what he eats due to his busy schedule. Home blood sugars 250's - 400's or sometimes more. He does not exercise outside of his normal routine but he does work multiple active jobs.  - Taking Lisinopril for high blood pressure. Doing well on regimen, no issues/concerns. Home blood pressures similar to today's. Does not add salt to food. Denies chest pain, shortness of breath, and additional red flag symptoms today in office. States he only has chest pain and shortness of breath during anxiety attacks.  - History of high triglycerides. States he was unaware that he was to take Zetia. He is fasting today for repeat cholesterol lab. - Established with [facility]. States he is ok with services with them for now. He is aware to notify me if he would like referral to a different psychiatrist in the future.     ROS per HPI     Health Maintenance:  Health Maintenance Due  Topic Date Due   COVID-19 Vaccine (1) Never done   FOOT EXAM  Never done   OPHTHALMOLOGY EXAM  Never done   HIV Screening  Never done   Diabetic kidney evaluation - Urine ACR  Never done   Hepatitis C Screening  Never done   DTaP/Tdap/Td (1 - Tdap) Never done   Diabetic kidney evaluation - eGFR measurement  02/03/2021     Past Medical History: Patient Active Problem List   Diagnosis Date Noted   Primary hypertension 04/15/2022   Type 2 diabetes mellitus with hyperglycemia ([place]) 02/07/2021   Shoulder subluxation, right, subsequent encounter 04/12/2020   Diabetes 1.5, 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 His smoking use included cigarettes. He has been exposed to tobacco smoke. He has never used smokeless tobacco. He reports that he does not currently use alcohol. He reports that he does not currently use drugs after having used the following drugs: Oxycodone.   Family History  family history includes Diabetes in his mother; Hypertension in his mother.  Medications: reviewed and updated   Objective:   Physical Exam BP 121/88 (BP Location: Left Arm, Patient Position: Sitting, Cuff Size: Large)   Pulse (!) 129   Temp 98.3 F (36.8 C)   Resp 18   Ht 5' 10.04" (1.779 m)   Wt 240 lb (108.9 kg)   SpO2 93%   BMI 34.40 kg/m   Physical Exam HENT:     Head: Normocephalic and atraumatic.  Eyes:     Extraocular Movements: Extraocular movements intact.     Conjunctiva/sclera: Conjunctivae  normal.     Pupils: Pupils are equal, round, and reactive to light.  Cardiovascular:     Rate and Rhythm: Tachycardia present.     Pulses: Normal pulses.     Heart sounds: Normal heart sounds.  Pulmonary:     Effort: Pulmonary effort is normal.     Breath sounds: Normal breath sounds.  Musculoskeletal:     Cervical back: Normal range of motion and neck supple.  Neurological:     General: No focal deficit present.     Mental Status: He is alert and oriented to person, place, and time.  Psychiatric:        Mood and Affect: Mood normal.        Behavior: Behavior normal.   Results for orders placed or performed in visit on 04/15/22  POCT glycosylated hemoglobin (Hb A1C)  Result Value Ref Range   Hemoglobin A1C 13.8 (A) 4.0 - 5.6 %   HbA1c POC (<> result, manual entry)     HbA1c, POC (prediabetic range)     HbA1c, POC (controlled diabetic range)         Assessment & Plan:  1. Encounter to establish care - Patient presents today to establish care.  - Return for annual physical examination, labs, and health maintenance. Arrive fasting meaning having no food for at least 8 hours prior to appointment. You may have only water or black coffee. Please take scheduled medications as normal.  2. Type 2 diabetes mellitus with hyperglycemia, unspecified whether long term insulin use (HCC) - Hemoglobin A1c not at goal at 13.8%, goal 7%. - Continue Metformin as prescribed.  Tyler Aas not on formulary under patient's current health insurance plan. - Insulin Glargine as prescribed. Counseled on medication adherence and adverse effects.  - Diabetic kidney evaluation - eGFR measurement pending.  - Routine CMP.  - Discussed the importance of healthy eating habits, low-carbohydrate diet, low-sugar diet, regular aerobic exercise (at least 150 minutes a week as tolerated) and medication compliance to achieve or maintain control of diabetes. - Referral to Medical Nutrition Therapy for further evaluation  and management. - Follow-up with clinical pharmacist in 4 weeks or sooner if needed for diabetes checkup. Write your home blood sugar results down each day and bring those results to your appointment along with your home glucose monitor. Medications may be revised at that time if needed. - Follow-up with primary provider as scheduled. - POCT glycosylated hemoglobin (Hb A1C) - CMP14+EGFR - Microalbumin / creatinine urine ratio - metFORMIN (GLUCOPHAGE-XR) 500 MG 24 hr tablet; Take 2 tablets (1,000 mg total) by mouth 2 (two) times daily.  Dispense: 120 tablet; Refill: 2 - Insulin Glargine (BASAGLAR KWIKPEN) 100 UNIT/ML; Inject 10 Units into the skin at bedtime.  Dispense: 15 mL; Refill: 0 - Insulin Pen Needle (PEN NEEDLES) 32G X 4 MM MISC; 1 each by Does not apply route at bedtime.  Dispense: 100 each; Refill: 0 - Amb ref to Medical Nutrition  Therapy-MNT  3. Primary hypertension - Continue Lisinopril as prescribed.  - Counseled on blood pressure goal of less than 130/80, low-sodium, DASH diet, medication compliance, and 150 minutes of moderate intensity exercise per week as tolerated. Counseled on medication adherence and adverse effects. - Routine CMP.  - Follow-up with primary provider in 3 months or sooner if needed.  - CMP14+EGFR - lisinopril (ZESTRIL) 20 MG tablet; Take 1 tablet (20 mg total) by mouth daily.  Dispense: 30 tablet; Refill: 2  4. Screening cholesterol level 5. History of high cholesterol - Routine lipid panel. - Lipid panel  6. Bipolar affective disorder, remission status unspecified ([place]) - Keep all scheduled appointments with Psychiatry.    Patient was given clear instructions to go to Emergency Department or return to medical center if symptoms don't improve, worsen, or new problems develop.The patient verbalized understanding.  I discussed the assessment and treatment plan with the patient. The patient was provided an opportunity to ask questions and all were  answered. The patient agreed with the plan and demonstrated an understanding of the instructions.   The patient was advised to call back or seek an in-person evaluation if the symptoms worsen or if the condition fails to improve as anticipated.    Durene Fruits, NP 04/15/2022, 9:36 AM Primary Care at [facility]

## 2022-04-15 ENCOUNTER — Encounter: Payer: Self-pay | Admitting: Family

## 2022-04-15 ENCOUNTER — Ambulatory Visit: Payer: 59 | Admitting: Family

## 2022-04-15 ENCOUNTER — Other Ambulatory Visit: Payer: Self-pay | Admitting: Family

## 2022-04-15 VITALS — BP 121/88 | HR 129 | Temp 98.3°F | Resp 18 | Ht 70.04 in | Wt 240.0 lb

## 2022-04-15 DIAGNOSIS — E1165 Type 2 diabetes mellitus with hyperglycemia: Secondary | ICD-10-CM

## 2022-04-15 DIAGNOSIS — Z13220 Encounter for screening for lipoid disorders: Secondary | ICD-10-CM | POA: Diagnosis not present

## 2022-04-15 DIAGNOSIS — F319 Bipolar disorder, unspecified: Secondary | ICD-10-CM | POA: Diagnosis not present

## 2022-04-15 DIAGNOSIS — Z7689 Persons encountering health services in other specified circumstances: Secondary | ICD-10-CM

## 2022-04-15 DIAGNOSIS — I10 Essential (primary) hypertension: Secondary | ICD-10-CM

## 2022-04-15 DIAGNOSIS — Z8639 Personal history of other endocrine, nutritional and metabolic disease: Secondary | ICD-10-CM

## 2022-04-15 LAB — POCT GLYCOSYLATED HEMOGLOBIN (HGB A1C): Hemoglobin A1C: 13.8 % — AB (ref 4.0–5.6)

## 2022-04-15 MED ORDER — METFORMIN HCL ER 500 MG PO TB24: 1000.0000 mg | ORAL_TABLET | Freq: Two times a day (BID) | ORAL | 2 refills | Status: DC

## 2022-04-15 MED ORDER — PEN NEEDLES 32G X 4 MM MISC: 1.0000 | Freq: Every day | 0 refills | Status: AC

## 2022-04-15 MED ORDER — BASAGLAR KWIKPEN 100 UNIT/ML ~~LOC~~ SOPN: 10.0000 [IU] | PEN_INJECTOR | Freq: Every day | SUBCUTANEOUS | 0 refills | Status: AC

## 2022-04-15 MED ORDER — LISINOPRIL 20 MG PO TABS: 20.0000 mg | ORAL_TABLET | Freq: Every day | ORAL | 2 refills | Status: DC

## 2022-04-15 NOTE — Patient Instructions (Signed)
Thank you for choosing Primary Care at [facility] for your medical home!    Mark Lindsey was seen by Rema Fendt, NP today.   Mark Lindsey's primary care provider is Rema Fendt, NP.   For the best care possible,  you should try to see Ricky Stabs, NP whenever you come to office.   We look forward to seeing you again soon!  If you have any questions about your visit today,  please call us at [phone]  Or feel free to reach your provider via MyChart.    Keeping you healthy   Get these tests Blood pressure- Have your blood pressure checked once a year by your healthcare provider.  Normal blood pressure is 120/80. Weight- Have your body mass index (BMI) calculated to screen for obesity.  BMI is a measure of body fat based on height and weight. You can also calculate your own BMI at https://www.west-esparza.com/. Cholesterol- Have your cholesterol checked regularly starting at age 65, sooner may be necessary if you have diabetes, high blood pressure, if a family member developed heart diseases at an early age or if you smoke.  Chlamydia, HIV, and other sexual transmitted disease- Get screened each year until the age of 41 then within three months of each new sexual partner. Diabetes- Have your blood sugar checked regularly if you have high blood pressure, high cholesterol, a family history of diabetes or if you are overweight.   Get these vaccines Flu shot- Every fall. Tetanus shot- Every 10 years. Menactra- Single dose; prevents meningitis.   Take these steps Don't smoke- If you do smoke, ask your healthcare provider about quitting. For tips on how to quit, go to www.smokefree.gov or call 1-800-QUIT-NOW. Be physically active- Exercise 5 days a week for at least 30 minutes.  If you are not already physically active start slow and gradually work up to 30 minutes of moderate physical activity.  Examples of moderate activity include walking briskly, mowing the  yard, dancing, swimming bicycling, etc. Eat a healthy diet- Eat a variety of healthy foods such as fruits, vegetables, low fat milk, low fat cheese, yogurt, lean meats, poultry, fish, beans, tofu, etc.  For more information on healthy eating, go to www.thenutritionsource.org Drink alcohol in moderation- Limit alcohol intake two drinks or less a day.  Never drink and drive. Dentist- Brush and floss teeth twice daily; visit your dentis twice a year. Depression-Your emotional health is as important as your physical health.  If you're feeling down, losing interest in things you normally enjoy please talk with your healthcare provider. Gun Safety- If you keep a gun in your home, keep it unloaded and with the safety lock on.  Bullets should be stored separately. Helmet use- Always wear a helmet when riding a motorcycle, bicycle, rollerblading or skateboarding. Safe sex- If you may be exposed to a sexually transmitted infection, use a condom Seat belts- Seat bels can save your life; always wear one. Smoke/Carbon Monoxide detectors- These detectors need to be installed on the appropriate level of your home.  Replace batteries at least once a year. Skin Cancer- When out in the sun, cover up and use sunscreen SPF 15 or higher. Violence- If anyone is threatening or hurting you, please tell your healthcare provider.

## 2022-04-15 NOTE — Progress Notes (Signed)
Pt presents to establish care,  -has not seen a PCP in over a year, has diabetes  -triglycerides 422 back in 2019 patient states that he does not take Zetia for cholesterol management  -psychiatry w/[facility]

## 2022-04-16 LAB — MICROALBUMIN / CREATININE URINE RATIO
Creatinine, Urine: 43.2 mg/dL
Microalb/Creat Ratio: 16 mg/g creat (ref 0–29)
Microalbumin, Urine: 6.8 ug/mL

## 2022-04-24 ENCOUNTER — Other Ambulatory Visit: Payer: Self-pay | Admitting: Family

## 2022-04-24 DIAGNOSIS — E781 Pure hyperglyceridemia: Secondary | ICD-10-CM

## 2022-04-24 LAB — CMP14+EGFR
Alkaline Phosphatase: 93 IU/L (ref 44–121)
BUN/Creatinine Ratio: 14 (ref 9–20)
BUN: 13 mg/dL (ref 6–24)
Bilirubin Total: 0.5 mg/dL (ref 0.0–1.2)
CO2: 15 mmol/L — ABNORMAL LOW (ref 20–29)
Calcium: 9.3 mg/dL (ref 8.7–10.2)
Chloride: 94 mmol/L — ABNORMAL LOW (ref 96–106)
Creatinine, Ser: 0.95 mg/dL (ref 0.76–1.27)
Glucose: 351 mg/dL — ABNORMAL HIGH (ref 70–99)
Potassium: 4.7 mmol/L (ref 3.5–5.2)
Sodium: 137 mmol/L (ref 134–144)
Total Protein: 6.7 g/dL (ref 6.0–8.5)
eGFR: 103 mL/min/{1.73_m2} (ref >59)

## 2022-04-24 LAB — LIPID PANEL
Chol/HDL Ratio: 20.3 ratio — ABNORMAL HIGH (ref 0.0–5.0)
Cholesterol, Total: 304 mg/dL — ABNORMAL HIGH (ref 100–199)
HDL: 15 mg/dL — ABNORMAL LOW (ref >39)
Triglycerides: 3032 mg/dL (ref 0–149)

## 2022-04-24 MED ORDER — ATORVASTATIN CALCIUM 20 MG PO TABS: 20.0000 mg | ORAL_TABLET | Freq: Every day | ORAL | 2 refills | Status: AC

## 2022-04-25 ENCOUNTER — Other Ambulatory Visit: Payer: Self-pay | Admitting: Family

## 2022-04-25 DIAGNOSIS — Z13228 Encounter for screening for other metabolic disorders: Secondary | ICD-10-CM

## 2022-05-16 ENCOUNTER — Ambulatory Visit: Payer: 59 | Admitting: Pharmacist

## 2022-06-09 ENCOUNTER — Ambulatory Visit: Payer: 59 | Admitting: Dietician

## 2022-06-10 ENCOUNTER — Encounter: Payer: 59 | Admitting: Family

## 2022-06-20 ENCOUNTER — Ambulatory Visit: Payer: 59 | Admitting: Pharmacist

## 2022-06-30 ENCOUNTER — Telehealth: Payer: 59 | Admitting: Physician Assistant

## 2022-06-30 DIAGNOSIS — B9689 Other specified bacterial agents as the cause of diseases classified elsewhere: Secondary | ICD-10-CM

## 2022-06-30 DIAGNOSIS — R051 Acute cough: Secondary | ICD-10-CM

## 2022-06-30 DIAGNOSIS — J019 Acute sinusitis, unspecified: Secondary | ICD-10-CM | POA: Diagnosis not present

## 2022-06-30 MED ORDER — FLUTICASONE PROPIONATE 50 MCG/ACT NA SUSP: 2.0000 | Freq: Every day | NASAL | 0 refills | Status: AC

## 2022-06-30 MED ORDER — AMOXICILLIN-POT CLAVULANATE 875-125 MG PO TABS: 1.0000 | ORAL_TABLET | Freq: Two times a day (BID) | ORAL | 0 refills | Status: AC

## 2022-06-30 MED ORDER — BENZONATATE 100 MG PO CAPS
100.0000 mg | ORAL_CAPSULE | Freq: Three times a day (TID) | ORAL | 0 refills | Status: AC | PRN
Start: 2022-06-30 — End: ?

## 2022-06-30 MED ORDER — PROMETHAZINE-DM 6.25-15 MG/5ML PO SYRP: 5.0000 mL | ORAL_SOLUTION | Freq: Four times a day (QID) | ORAL | 0 refills | Status: AC | PRN

## 2022-06-30 NOTE — Progress Notes (Signed)
Virtual Visit Consent   Mark Lindsey, you are scheduled for a virtual visit with a [facility] provider today. Just as with appointments in the office, your consent must be obtained to participate. Your consent will be active for this visit and any virtual visit you may have with one of our providers in the next 365 days. If you have a MyChart account, a copy of this consent can be sent to you electronically.  As this is a virtual visit, video technology does not allow for your provider to perform a traditional examination. This may limit your provider's ability to fully assess your condition. If your provider identifies any concerns that need to be evaluated in person or the need to arrange testing (such as labs, EKG, etc.), we will make arrangements to do so. Although advances in technology are sophisticated, we cannot ensure that it will always work on either your end or our end. If the connection with a video visit is poor, the visit may have to be switched to a telephone visit. With either a video or telephone visit, we are not always able to ensure that we have a secure connection.  By engaging in this virtual visit, you consent to the provision of healthcare and authorize for your insurance to be billed (if applicable) for the services provided during this visit. Depending on your insurance coverage, you may receive a charge related to this service.  I need to obtain your verbal consent now. Are you willing to proceed with your visit today? Mark Lindsey has provided verbal consent on 06/30/2022 for a virtual visit (video or telephone). Mar Daring, PA-C  Date: 06/30/2022 11:19 AM  Virtual Visit via Video Note   I, Mar Daring, connected with  Mark Lindsey  ([id number], 1980/05/20) on 06/30/22 at 11:15 AM EDT by a video-enabled telemedicine application and verified that I am speaking with the correct person using two identifiers.  Location: Patient:  Virtual Visit Location Patient: Home Provider: Virtual Visit Location Provider: Home Office   I discussed the limitations of evaluation and management by telemedicine and the availability of in person appointments. The patient expressed understanding and agreed to proceed.    History of Present Illness: Mark Lindsey is a 42 y.o. who identifies as a male who was assigned male at birth, and is being seen today for URI symptoms.  HPI: URI  This is a new problem. The current episode started in the past 7 days. The problem has been gradually worsening. There has been no fever. Associated symptoms include congestion, coughing (intermittent sore throat), headaches, a plugged ear sensation, rhinorrhea, sinus pain and a sore throat. Pertinent negatives include no diarrhea, ear pain, nausea or vomiting. Associated symptoms comments: Body aches, chills. Treatments tried: dayquil, coricidin hbp, ibuprofen. The treatment provided no relief.     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   Elevated d-dimer 07/17/2019   Pneumonia due to COVID-19 virus 07/17/2019   Thrombocytopenia ([place]) 07/17/2019   Diabetes mellitus ([place]) 06/02/2017   Gastroesophageal reflux disease 04/30/2017   Essential hypertension 10/10/2015   History of asthma 10/10/2015   History of migraine headaches 10/10/2015   Bipolar disorder, unspecified ([place]) 11/20/2011    Allergies:  Allergies  Allergen Reactions   Quetiapine  Nausea And Vomiting, Other (See Comments) and Palpitations    Pt stated, "Gives me heart palpitations"   Buspirone Other (See Comments) and Palpitations    Headaches  Headaches  Headaches   Sulfa Antibiotics Nausea And Vomiting and Nausea Only   Quetiapine Fumarate Nausea  And Vomiting   Sulfamethoxazole-Trimethoprim Diarrhea and Nausea And Vomiting   Medications:  Current Outpatient Medications:    amoxicillin-clavulanate (AUGMENTIN) 875-125 MG tablet, Take 1 tablet by mouth 2 (two) times daily., Disp: 20 tablet, Rfl: 0   benzonatate (TESSALON) 100 MG capsule, Take 1 capsule (100 mg total) by mouth 3 (three) times daily as needed., Disp: 30 capsule, Rfl: 0   fluticasone (FLONASE) 50 MCG/ACT nasal spray, Place 2 sprays into both nostrils daily., Disp: 16 g, Rfl: 0   promethazine-dextromethorphan (PROMETHAZINE-DM) 6.25-15 MG/5ML syrup, Take 5 mLs by mouth 4 (four) times daily as needed., Disp: 118 mL, Rfl: 0   Accu-Chek FastClix Lancets MISC, Use to monitor blood glucose 3  time(s) daily. ICD10:E11.69, Disp: , Rfl:    atorvastatin (LIPITOR) 20 MG tablet, Take 1 tablet (20 mg total) by mouth daily., Disp: 30 tablet, Rfl: 2   glucose blood (PRECISION QID TEST) test strip, 1 each by Other route., Disp: , Rfl:    glucose blood (PRECISION QID TEST) test strip, 1 each by Other route., Disp: , Rfl:    hydrOXYzine (ATARAX) 10 MG tablet, Take 10 mg by mouth as needed for anxiety., Disp: , Rfl:    ibuprofen (ADVIL) 600 MG tablet, Take 1 tablet (600 mg total) by mouth every 8 (eight) hours as needed., Disp: 30 tablet, Rfl: 0   Insulin Glargine (BASAGLAR KWIKPEN) 100 UNIT/ML, Inject 10 Units into the skin at bedtime., Disp: 15 mL, Rfl: 0   Insulin Pen Needle (PEN NEEDLES) 32G X 4 MM MISC, 1 each by Does not apply route at bedtime., Disp: 100 each, Rfl: 0   lamoTRIgine (LAMICTAL) 150 MG tablet, Take 150 mg by mouth daily., Disp: , Rfl:    lisinopril (ZESTRIL) 20 MG tablet, Take 1 tablet (20 mg total) by mouth daily., Disp: 30 tablet, Rfl: 2   metFORMIN (GLUCOPHAGE-XR) 500 MG 24 hr tablet, Take 2 tablets (1,000 mg total) by mouth 2 (two) times daily., Disp: 120 tablet, Rfl: 2   omeprazole (PRILOSEC) 40 MG capsule, Take 40 mg by mouth daily., Disp: , Rfl:    tiZANidine (ZANAFLEX)  4 MG tablet, Take 1 tablet (4 mg total) by mouth at bedtime., Disp: 30 tablet, Rfl: 0  Observations/Objective: Patient is well-developed, well-nourished in no acute distress.  Resting comfortably at home.  Head is normocephalic, atraumatic.  No labored breathing.  Speech is clear and coherent with logical content.  Patient is alert and oriented at baseline.    Assessment and Plan: 1. Acute bacterial sinusitis - amoxicillin-clavulanate (AUGMENTIN) 875-125 MG tablet; Take 1 tablet by mouth 2 (two) times daily.  Dispense: 20 tablet; Refill: 0 - fluticasone (FLONASE) 50 MCG/ACT nasal spray; Place 2 sprays into both nostrils daily.  Dispense: 16 g; Refill: 0  2. Acute cough - promethazine-dextromethorphan (PROMETHAZINE-DM) 6.25-15 MG/5ML syrup; Take 5 mLs by mouth 4 (four) times daily as needed.  Dispense: 118 mL; Refill: 0 - benzonatate (TESSALON) 100 MG capsule; Take 1 capsule (100 mg total) by mouth 3 (three) times daily as needed.  Dispense: 30 capsule; Refill: 0  - Worsening symptoms that have not responded to OTC medications.  - Will give Augmentin and Flonase - Promethazine DM and Tessalon perles  for cough - Continue allergy medications.  - Steam and humidifier can help - Stay well hydrated and get plenty of rest.  - Seek in person evaluation if no symptom improvement or if symptoms worsen   Follow Up Instructions: I discussed the assessment and treatment plan with the patient. The patient was provided an opportunity to ask questions and all were answered. The patient agreed with the plan and demonstrated an understanding of the instructions.  A copy of instructions were sent to the patient via MyChart unless otherwise noted below.    The patient was advised to call back or seek an in-person evaluation if the symptoms worsen or if the condition fails to improve as anticipated.  Time:  I spent 10 minutes with the patient via telehealth technology discussing the above  problems/concerns.    Mar Daring, PA-C

## 2022-06-30 NOTE — Patient Instructions (Signed)
Mark Lindsey, thank you for joining Mar Daring, PA-C for today's virtual visit.  While this provider is not your primary care provider (PCP), if your PCP is located in our provider database this encounter information will be shared with them immediately following your visit.   [facility] account gives you access to today's visit and all your visits, tests, and labs performed at [facility] " click here if you don't have a [facility] account or go to [email or web address]  Consent: (Patient) Mark Lindsey provided verbal consent for this virtual visit at the beginning of the encounter.  Current Medications:  Current Outpatient Medications:    amoxicillin-clavulanate (AUGMENTIN) 875-125 MG tablet, Take 1 tablet by mouth 2 (two) times daily., Disp: 20 tablet, Rfl: 0   benzonatate (TESSALON) 100 MG capsule, Take 1 capsule (100 mg total) by mouth 3 (three) times daily as needed., Disp: 30 capsule, Rfl: 0   fluticasone (FLONASE) 50 MCG/ACT nasal spray, Place 2 sprays into both nostrils daily., Disp: 16 g, Rfl: 0   promethazine-dextromethorphan (PROMETHAZINE-DM) 6.25-15 MG/5ML syrup, Take 5 mLs by mouth 4 (four) times daily as needed., Disp: 118 mL, Rfl: 0   Accu-Chek FastClix Lancets MISC, Use to monitor blood glucose 3  time(s) daily. ICD10:E11.69, Disp: , Rfl:    atorvastatin (LIPITOR) 20 MG tablet, Take 1 tablet (20 mg total) by mouth daily., Disp: 30 tablet, Rfl: 2   glucose blood (PRECISION QID TEST) test strip, 1 each by Other route., Disp: , Rfl:    glucose blood (PRECISION QID TEST) test strip, 1 each by Other route., Disp: , Rfl:    hydrOXYzine (ATARAX) 10 MG tablet, Take 10 mg by mouth as needed for anxiety., Disp: , Rfl:    ibuprofen (ADVIL) 600 MG tablet, Take 1 tablet (600 mg total) by mouth every 8 (eight) hours as needed., Disp: 30 tablet, Rfl: 0   Insulin Glargine (BASAGLAR KWIKPEN) 100 UNIT/ML, Inject 10 Units into the skin  at bedtime., Disp: 15 mL, Rfl: 0   Insulin Pen Needle (PEN NEEDLES) 32G X 4 MM MISC, 1 each by Does not apply route at bedtime., Disp: 100 each, Rfl: 0   lamoTRIgine (LAMICTAL) 150 MG tablet, Take 150 mg by mouth daily., Disp: , Rfl:    lisinopril (ZESTRIL) 20 MG tablet, Take 1 tablet (20 mg total) by mouth daily., Disp: 30 tablet, Rfl: 2   metFORMIN (GLUCOPHAGE-XR) 500 MG 24 hr tablet, Take 2 tablets (1,000 mg total) by mouth 2 (two) times daily., Disp: 120 tablet, Rfl: 2   omeprazole (PRILOSEC) 40 MG capsule, Take 40 mg by mouth daily., Disp: , Rfl:    tiZANidine (ZANAFLEX) 4 MG tablet, Take 1 tablet (4 mg total) by mouth at bedtime., Disp: 30 tablet, Rfl: 0   Medications ordered in this encounter:  Meds ordered this encounter  Medications   amoxicillin-clavulanate (AUGMENTIN) 875-125 MG tablet    Sig: Take 1 tablet by mouth 2 (two) times daily.    Dispense:  20 tablet    Refill:  0    Order Specific Question:   Supervising Provider    Answer:   Chase Picket [id number]   promethazine-dextromethorphan (PROMETHAZINE-DM) 6.25-15 MG/5ML syrup    Sig: Take 5 mLs by mouth 4 (four) times daily as needed.    Dispense:  118 mL    Refill:  0    Order Specific Question:   Supervising Provider    Answer:   Chase Picket [id number]  benzonatate (TESSALON) 100 MG capsule    Sig: Take 1 capsule (100 mg total) by mouth 3 (three) times daily as needed.    Dispense:  30 capsule    Refill:  0    Order Specific Question:   Supervising Provider    Answer:   Chase Picket [id number]   fluticasone (FLONASE) 50 MCG/ACT nasal spray    Sig: Place 2 sprays into both nostrils daily.    Dispense:  16 g    Refill:  0    Order Specific Question:   Supervising Provider    Answer:   Chase Picket [id number]     *If you need refills on other medications prior to your next appointment, please contact your pharmacy*  Follow-Up: Call back or seek an in-person evaluation if the symptoms worsen or  if the condition fails to improve as anticipated.  [place] [phone]  Other Instructions  Sinus Infection, Adult A sinus infection, also called sinusitis, is inflammation of your sinuses. Sinuses are hollow spaces in the bones around your face. Your sinuses are located: Around your eyes. In the middle of your forehead. Behind your nose. In your cheekbones. Mucus normally drains out of your sinuses. When your nasal tissues become inflamed or swollen, mucus can become trapped or blocked. This allows bacteria, viruses, and fungi to grow, which leads to infection. Most infections of the sinuses are caused by a virus. A sinus infection can develop quickly. It can last for up to 4 weeks (acute) or for more than 12 weeks (chronic). A sinus infection often develops after a cold. What are the causes? This condition is caused by anything that creates swelling in the sinuses or stops mucus from draining. This includes: Allergies. Asthma. Infection from bacteria or viruses. Deformities or blockages in your nose or sinuses. Abnormal growths in the nose (nasal polyps). Pollutants, such as chemicals or irritants in the air. Infection from fungi. This is rare. What increases the risk? You are more likely to develop this condition if you: Have a weak body defense system (immune system). Do a lot of swimming or diving. Overuse nasal sprays. Smoke. What are the signs or symptoms? The main symptoms of this condition are pain and a feeling of pressure around the affected sinuses. Other symptoms include: Stuffy nose or congestion that makes it difficult to breathe through your nose. Thick yellow or greenish drainage from your nose. Tenderness, swelling, and warmth over the affected sinuses. A cough that may get worse at night. Decreased sense of smell and taste. Extra mucus that collects in the throat or the back of the nose (postnasal drip) causing a sore throat or bad  breath. Tiredness (fatigue). Fever. How is this diagnosed? This condition is diagnosed based on: Your symptoms. Your medical history. A physical exam. Tests to find out if your condition is acute or chronic. This may include: Checking your nose for nasal polyps. Viewing your sinuses using a device that has a light (endoscope). Testing for allergies or bacteria. Imaging tests, such as an MRI or CT scan. In rare cases, a bone biopsy may be done to rule out more serious types of fungal sinus disease. How is this treated? Treatment for a sinus infection depends on the cause and whether your condition is chronic or acute. If caused by a virus, your symptoms should go away on their own within 10 days. You may be given medicines to relieve symptoms. They include: Medicines that shrink swollen nasal  passages (decongestants). A spray that eases inflammation of the nostrils (topical intranasal corticosteroids). Rinses that help get rid of thick mucus in your nose (nasal saline washes). Medicines that treat allergies (antihistamines). Over-the-counter pain relievers. If caused by bacteria, your health care provider may recommend waiting to see if your symptoms improve. Most bacterial infections will get better without antibiotic medicine. You may be given antibiotics if you have: A severe infection. A weak immune system. If caused by narrow nasal passages or nasal polyps, surgery may be needed. Follow these instructions at home: Medicines Take, use, or apply over-the-counter and prescription medicines only as told by your health care provider. These may include nasal sprays. If you were prescribed an antibiotic medicine, take it as told by your health care provider. Do not stop taking the antibiotic even if you start to feel better. Hydrate and humidify  Drink enough fluid to keep your urine pale yellow. Staying hydrated will help to thin your mucus. Use a cool mist humidifier to keep the  humidity level in your home above 50%. Inhale steam for 10-15 minutes, 3-4 times a day, or as told by your health care provider. You can do this in the bathroom while a hot shower is running. Limit your exposure to cool or dry air. Rest Rest as much as possible. Sleep with your head raised (elevated). Make sure you get enough sleep each night. General instructions  Apply a warm, moist washcloth to your face 3-4 times a day or as told by your health care provider. This will help with discomfort. Use nasal saline washes as often as told by your health care provider. Wash your hands often with soap and water to reduce your exposure to germs. If soap and water are not available, use hand sanitizer. Do not smoke. Avoid being around people who are smoking (secondhand smoke). Keep all follow-up visits. This is important. Contact a health care provider if: You have a fever. Your symptoms get worse. Your symptoms do not improve within 10 days. Get help right away if: You have a severe headache. You have persistent vomiting. You have severe pain or swelling around your face or eyes. You have vision problems. You develop confusion. Your neck is stiff. You have trouble breathing. These symptoms may be an emergency. Get help right away. Call 911. Do not wait to see if the symptoms will go away. Do not drive yourself to the hospital. Summary A sinus infection is soreness and inflammation of your sinuses. Sinuses are hollow spaces in the bones around your face. This condition is caused by nasal tissues that become inflamed or swollen. The swelling traps or blocks the flow of mucus. This allows bacteria, viruses, and fungi to grow, which leads to infection. If you were prescribed an antibiotic medicine, take it as told by your health care provider. Do not stop taking the antibiotic even if you start to feel better. Keep all follow-up visits. This is important. This information is not intended to  replace advice given to you by your health care provider. Make sure you discuss any questions you have with your health care provider. Document Revised: 03/12/2021 Document Reviewed: 03/12/2021 Elsevier Patient Education  [place].    If you have been instructed to have an in-person evaluation today at a local Urgent Care facility, please use the link below. It will take you to a list of all of our available [facility], including address, phone number and hours of operation. Please do  not delay care.  [facility]  If you or a family member do not have a primary care provider, use the link below to schedule a visit and establish care. When you choose a [facility] primary care physician or advanced practice provider, you gain a long-term partner in health. Find a Primary Care Provider  Learn more about [facility]'s in-office and virtual care options: [facility]

## 2022-07-03 ENCOUNTER — Ambulatory Visit
Admission: EM | Admit: 2022-07-03 | Discharge: 2022-07-03 | Disposition: A | Discharge status: Home / Self Care | Payer: 59 | Attending: Physician Assistant | Admitting: Physician Assistant

## 2022-07-03 ENCOUNTER — Ambulatory Visit (INDEPENDENT_AMBULATORY_CARE_PROVIDER_SITE_OTHER): Payer: 59

## 2022-07-03 DIAGNOSIS — R059 Cough, unspecified: Secondary | ICD-10-CM

## 2022-07-03 DIAGNOSIS — J22 Unspecified acute lower respiratory infection: Secondary | ICD-10-CM | POA: Diagnosis not present

## 2022-07-03 DIAGNOSIS — R0989 Other specified symptoms and signs involving the circulatory and respiratory systems: Secondary | ICD-10-CM | POA: Diagnosis not present

## 2022-07-03 DIAGNOSIS — J069 Acute upper respiratory infection, unspecified: Secondary | ICD-10-CM

## 2022-07-03 MED ORDER — IBUPROFEN 600 MG PO TABS: 600.0000 mg | ORAL_TABLET | Freq: Four times a day (QID) | ORAL | 0 refills | Status: AC | PRN

## 2022-07-03 NOTE — ED Provider Notes (Signed)
[facility]    CSN: [id number] Arrival date & time: 07/03/22  1237      History   Chief Complaint Chief Complaint  Patient presents with   Cough    HPI Mark Lindsey is a 42 y.o. male.   42 year old male presents with chest congestion, pain with cough.  Patient indicates for the past week he has been having upper respiratory congestion with postnasal drip, rhinitis sinus congestion with mainly clear production.  Patient indicates he has also been having chest congestion with intermittent cough, coughing exacerbations, with clear to yellow production.  Patient indicates he did have a telemedicine visit several days ago and was placed on Augmentin 875 every 12 hours to treat respiratory infection.  Patient indicates that he thought he was doing better until he woke up this morning started having pain right upper chest wall that is worse with cough, and breathing deep.  He is without wheezing, shortness of breath.  He relates he has not been having fever or chills.  He is tolerating fluids well and has been taking Tessalon Perles to control the cough.   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 No pertinent surgical history.     Home Medications    Prior to Admission medications   Medication Sig Start Date End Date Taking? Authorizing Provider  ibuprofen (ADVIL) 600 MG tablet Take 1 tablet (600 mg total) by mouth every 6 (six) hours as needed. 07/03/22  Yes Nyoka Lint, PA-C  Accu-Chek FastClix Lancets MISC Use to monitor blood glucose 3  time(s) daily. ICD10:E11.69 02/07/21   [provider]  amoxicillin-clavulanate (AUGMENTIN) 875-125 MG tablet Take 1 tablet by mouth 2 (two) times daily. 06/30/22   Mar Daring, PA-C  atorvastatin (LIPITOR) 20 MG tablet Take 1 tablet (20 mg total) by mouth daily. 04/24/22 07/23/22  Camillia Herter, NP  benzonatate (TESSALON) 100 MG capsule Take 1 capsule (100 mg total) by mouth 3 (three) times daily as needed. 06/30/22   Mar Daring, PA-C  fluticasone (FLONASE) 50 MCG/ACT nasal spray Place 2 sprays into both nostrils daily. 06/30/22   Mar Daring, PA-C  glucose blood (PRECISION QID TEST) test strip 1 each by Other route. 09/30/19   [provider]  glucose blood (PRECISION QID TEST) test strip 1 each by Other route. 09/30/19   [provider]  hydrOXYzine (ATARAX) 10 MG tablet Take 10 mg by mouth as needed for anxiety. 02/19/21   [provider]  ibuprofen (ADVIL) 600 MG tablet Take 1 tablet (600 mg total) by mouth every 8 (eight) hours as needed. 12/10/21   Brunetta Jeans, PA-C  Insulin Glargine [facility] KWIKPEN) 100 UNIT/ML Inject 10  Units into the skin at bedtime. 04/15/22   Camillia Herter, NP  Insulin Pen Needle (PEN NEEDLES) 32G X 4 MM MISC 1 each by Does not apply route at bedtime. 04/15/22   Camillia Herter, NP  lamoTRIgine (LAMICTAL) 150 MG tablet Take 150 mg by mouth daily.    [provider]  lisinopril (ZESTRIL) 20 MG tablet Take 1 tablet (20 mg total) by mouth daily. 04/15/22 07/14/22  Camillia Herter, NP  metFORMIN (GLUCOPHAGE-XR) 500 MG 24 hr tablet Take 2  tablets (1,000 mg total) by mouth 2 (two) times daily. 04/15/22 07/14/22  Camillia Herter, NP  omeprazole (PRILOSEC) 40 MG capsule Take 40 mg by mouth daily. 02/10/20   [provider]  promethazine-dextromethorphan (PROMETHAZINE-DM) 6.25-15 MG/5ML syrup Take 5 mLs by mouth 4 (four) times daily as needed. 06/30/22   Mar Daring, PA-C  tiZANidine (ZANAFLEX) 4 MG tablet Take 1 tablet (4 mg total) by mouth at bedtime. 08/12/21   Jaynee Eagles, PA-C  GLIPIZIDE PO Take by mouth.  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      Physical Exam Triage Vital Signs ED Triage Vitals [07/03/22 1310]  Enc Vitals Group     BP (!) 172/121     Pulse Rate 100     Resp 16     Temp 98 F (36.7 C)     Temp Source Oral     SpO2 96 %     Weight      Height      Head Circumference      Peak Flow      Pain Score 5     Pain Loc      Pain Edu?      Excl. in [place]?    No data found.  Updated Vital Signs BP (!) 172/121 (BP Location: Left Arm)   Pulse 100   Temp 98 F (36.7 C) (Oral)   Resp 16   SpO2 96%   Visual Acuity Right Eye Distance:   Left Eye Distance:   Bilateral Distance:    Right Eye Near:   Left Eye Near:    Bilateral Near:     Physical Exam Constitutional:      Appearance: Normal appearance.  HENT:      Right Ear: Tympanic membrane and ear canal normal.     Left Ear: Tympanic membrane and ear canal normal.     Mouth/Throat:     Mouth: Mucous membranes are moist.     Pharynx: Oropharynx is clear.  Cardiovascular:     Rate and Rhythm: Normal rate and regular rhythm.     Heart sounds: Normal heart sounds.  Pulmonary:     Effort: Pulmonary effort  is normal.     Breath sounds: Normal breath sounds and air entry. No wheezing, rhonchi or rales.  Lymphadenopathy:     Cervical: No cervical adenopathy.  Neurological:     Mental Status: He is alert.      UC Treatments / Results  Labs (all labs ordered are listed, but only abnormal results are displayed) Labs Reviewed - No data to display  EKG   Radiology DG Chest 2 View  Result Date: 07/03/2022 CLINICAL DATA:  Cough congestion EXAM: CHEST - 2 VIEW COMPARISON:  None Available. FINDINGS: Low volume exam again noted with nonspecific interstitial prominence. No focal pneumonia, collapse consolidation. Negative for edema, effusion, or pneumothorax. Trachea midline. IMPRESSION: Low volume exam without acute process. Electronically Signed   By: Jerilynn Mages.  Shick M.D.   On: 07/03/2022 13:48    Procedures Procedures (including critical care time)  Medications Ordered in UC Medications - No data to display  Initial Impression / Assessment and Plan / UC Course  I have reviewed the triage vital signs and the nursing notes.  Pertinent labs & imaging results that were available during my care of the patient were reviewed by me and considered in my medical decision making (see chart for details).    Plan: The diagnosis to be treated with the following: Upper respiratory tract infection: A.  Advised to continue taking Tessalon Perles to control the cough, or Mucinex DM to control congestion and cough. 2.  Lower respiratory infection: A.  Patient advised to continue taking Augmentin 875 mg every 12 hours to treat infection. B.  Advised take ibuprofen  600 mg every 6 hours with food to help relieve discomfort and pain from coughing. 3.  Advised follow-up PCP return to urgent care as needed.  Final Clinical Impressions(s) / UC Diagnoses   Final diagnoses:  Acute upper respiratory infection  Lower respiratory infection     Discharge Instructions      Continue to take the Augmentin, 875 mg every 12 hours until completed. Advised take ibuprofen as needed for pain relief, 600 mg every 6-8 hours as needed. Advised to continue to take the Tessalon Perles to control cough.  Advised follow-up PCP return to urgent care as needed.    ED Prescriptions     Medication Sig Dispense Auth. Provider   ibuprofen (ADVIL) 600 MG tablet Take 1 tablet (600 mg total) by mouth every 6 (six) hours as needed. 30 tablet Nyoka Lint, PA-C      PDMP not reviewed this encounter.   Nyoka Lint, PA-C 07/03/22 1400

## 2022-07-03 NOTE — ED Triage Notes (Signed)
Pt states cough,congestion,now having pain in the right side of his chest that goes through to his right back.  States he is on Augmentin for the past 3 days. Had a tele visit with his primary care doctor.

## 2022-07-03 NOTE — Discharge Instructions (Addendum)
Continue to take the Augmentin, 875 mg every 12 hours until completed. Advised take ibuprofen as needed for pain relief, 600 mg every 6-8 hours as needed. Advised to continue to take the Tessalon Perles to control cough.  Advised follow-up PCP return to urgent care as needed.

## 2022-08-04 ENCOUNTER — Encounter: Payer: Self-pay | Admitting: *Deleted

## 2022-10-02 ENCOUNTER — Emergency Department (HOSPITAL_BASED_OUTPATIENT_CLINIC_OR_DEPARTMENT_OTHER)
Admission: EM | Admit: 2022-10-02 | Discharge: 2022-10-02 | Disposition: A | Discharge status: Home / Self Care | Payer: BC Managed Care – PPO | Attending: Emergency Medicine | Admitting: Emergency Medicine

## 2022-10-02 ENCOUNTER — Encounter (HOSPITAL_BASED_OUTPATIENT_CLINIC_OR_DEPARTMENT_OTHER): Payer: Self-pay | Admitting: Emergency Medicine

## 2022-10-02 ENCOUNTER — Other Ambulatory Visit: Payer: Self-pay

## 2022-10-02 DIAGNOSIS — R42 Dizziness and giddiness: Secondary | ICD-10-CM | POA: Diagnosis not present

## 2022-10-02 DIAGNOSIS — R739 Hyperglycemia, unspecified: Secondary | ICD-10-CM

## 2022-10-02 DIAGNOSIS — Z7984 Long term (current) use of oral hypoglycemic drugs: Secondary | ICD-10-CM | POA: Insufficient documentation

## 2022-10-02 DIAGNOSIS — R000 Tachycardia, unspecified: Secondary | ICD-10-CM | POA: Diagnosis not present

## 2022-10-02 DIAGNOSIS — Z794 Long term (current) use of insulin: Secondary | ICD-10-CM | POA: Insufficient documentation

## 2022-10-02 DIAGNOSIS — E1165 Type 2 diabetes mellitus with hyperglycemia: Secondary | ICD-10-CM | POA: Diagnosis not present

## 2022-10-02 LAB — BASIC METABOLIC PANEL
Anion gap: 8 (ref 5–15)
BUN: 17 mg/dL (ref 6–20)
CO2: 24 mmol/L (ref 22–32)
Calcium: 9.2 mg/dL (ref 8.9–10.3)
Chloride: 99 mmol/L (ref 98–111)
Creatinine, Ser: 0.83 mg/dL (ref 0.61–1.24)
GFR, Estimated: >60 mL/min (ref >60)
Glucose, Bld: 457 mg/dL — ABNORMAL HIGH (ref 70–99)
Potassium: 3.9 mmol/L (ref 3.5–5.1)
Sodium: 131 mmol/L — ABNORMAL LOW (ref 135–145)

## 2022-10-02 LAB — URINALYSIS, ROUTINE W REFLEX MICROSCOPIC
Bacteria, UA: NONE SEEN
Bilirubin Urine: NEGATIVE
Glucose, UA: >1000 mg/dL — AB
Hgb urine dipstick: NEGATIVE
Ketones, ur: 15 mg/dL — AB
Leukocytes,Ua: NEGATIVE
Nitrite: NEGATIVE
Protein, ur: NEGATIVE mg/dL
Specific Gravity, Urine: 1.032 — ABNORMAL HIGH (ref 1.005–1.030)
pH: 7 (ref 5.0–8.0)

## 2022-10-02 LAB — CBG MONITORING, ED
Glucose-Capillary: 451 mg/dL — ABNORMAL HIGH (ref 70–99)
Glucose-Capillary: 361 mg/dL — ABNORMAL HIGH (ref 70–99)
Glucose-Capillary: 277 mg/dL — ABNORMAL HIGH (ref 70–99)

## 2022-10-02 LAB — CBC
HCT: 44.9 % (ref 39.0–52.0)
Hemoglobin: 16.2 g/dL (ref 13.0–17.0)
MCH: 28.5 pg (ref 26.0–34.0)
MCHC: 36.1 g/dL — ABNORMAL HIGH (ref 30.0–36.0)
MCV: 78.9 fL — ABNORMAL LOW (ref 80.0–100.0)
Platelets: 186 10*3/uL (ref 150–400)
RBC: 5.69 MIL/uL (ref 4.22–5.81)
RDW: 12.5 % (ref 11.5–15.5)
WBC: 6.5 10*3/uL (ref 4.0–10.5)
nRBC: 0 % (ref 0.0–0.2)

## 2022-10-02 MED ORDER — INSULIN REGULAR HUMAN 100 UNIT/ML IJ SOLN
6.0000 [IU] | Freq: Once | INTRAMUSCULAR | Status: DC
  Filled 2022-10-02: qty 3

## 2022-10-02 MED ORDER — SODIUM CHLORIDE 0.9 % IV BOLUS
1000.0000 mL | Freq: Once | INTRAVENOUS | Status: AC
  Administered 2022-10-02: 1000 mL via INTRAVENOUS

## 2022-10-02 MED ORDER — INSULIN ASPART 100 UNIT/ML IJ SOLN
6.0000 [IU] | Freq: Once | INTRAMUSCULAR | Status: AC
  Administered 2022-10-02: 6 [IU] via SUBCUTANEOUS

## 2022-10-02 NOTE — ED Triage Notes (Signed)
Pt arrives pov, steady gait with c/o "feeling lightheaded. Reports check cbg at work 436.

## 2022-10-02 NOTE — Discharge Instructions (Addendum)
It is extremely important you take all of your diabetes medicines as prescribed.  This includes your insulin at night as well as your metformin twice a day.  Missing doses of these patients can lead to very high and uncontrolled blood sugars.  Please schedule follow-up appointment with primary care doctor's office to have your blood sugars reevaluated, as well as your insulin dosing readjusted.  In the meantime, get back on your regular regimen every single day.  Continue drinking plenty of water.

## 2022-10-02 NOTE — ED Provider Notes (Signed)
[place] EMERGENCY DEPARTMENT AT [facility] Provider Note   CSN: [id number] Arrival date & time: 10/02/22  1155     History  Chief Complaint  Patient presents with   Hyperglycemia    Mark Lindsey is a 42 y.o. male with history of diabetes on metformin and insulin presenting to the ED with complaint of lightheadedness and hyperglycemia.  Patient reports that he is compliant with his insulin medication only intermittently, and that he missed at least 2 days of both his oral metformin as well as his insulin.  Typically takes 1000 mg of metformin twice daily as well as 15 units of long-acting insulin at night.  He says he is "pretty much whenever I want and can get my hands on".  He reports that his blood sugars were over 400 today at work, which he checked because he was having spells of lightheadedness.  He drinks plenty of water and keeps a jug with him at work.  HPI     Home Medications Prior to Admission medications   Medication Sig Start Date End Date Taking? Authorizing Provider  Accu-Chek FastClix Lancets MISC Use to monitor blood glucose 3  time(s) daily. ICD10:E11.69 02/07/21   [provider]  amoxicillin-clavulanate (AUGMENTIN) 875-125 MG tablet Take 1 tablet by mouth 2 (two) times daily. 06/30/22   Margaretann Loveless, PA-C  atorvastatin (LIPITOR) 20 MG tablet Take 1 tablet (20 mg total) by mouth daily. 04/24/22 07/23/22  Rema Fendt, NP  benzonatate (TESSALON) 100 MG capsule Take 1 capsule (100 mg total) by mouth 3 (three) times daily as needed. 06/30/22   Margaretann Loveless, PA-C  fluticasone (FLONASE) 50 MCG/ACT nasal spray Place 2 sprays into both nostrils daily. 06/30/22   Margaretann Loveless, PA-C  glucose blood (PRECISION QID TEST) test strip 1 each by Other route. 09/30/19   [provider]  glucose blood (PRECISION QID TEST) test strip 1 each by Other route. 09/30/19   [provider]  hydrOXYzine (ATARAX) 10 MG tablet  Take 10 mg by mouth as needed for anxiety. 02/19/21   [provider]  ibuprofen (ADVIL) 600 MG tablet Take 1 tablet (600 mg total) by mouth every 8 (eight) hours as needed. 12/10/21   Waldon Merl, PA-C  ibuprofen (ADVIL) 600 MG tablet Take 1 tablet (600 mg total) by mouth every 6 (six) hours as needed. 07/03/22   Ellsworth Lennox, PA-C  Insulin Glargine [facility]) 100 UNIT/ML Inject 10 Units into the skin at bedtime. 04/15/22   Rema Fendt, NP  Insulin Pen Needle (PEN NEEDLES) 32G X 4 MM MISC 1 each by Does not apply route at bedtime. 04/15/22   Rema Fendt, NP  lamoTRIgine (LAMICTAL) 150 MG tablet Take 150 mg by mouth daily.    [provider]  lisinopril (ZESTRIL) 20 MG tablet Take 1 tablet (20 mg total) by mouth daily. 04/15/22 07/14/22  Rema Fendt, NP  metFORMIN (GLUCOPHAGE-XR) 500 MG 24 hr tablet Take 2 tablets (1,000 mg total) by mouth 2 (two) times daily. 04/15/22 07/14/22  Rema Fendt, NP  omeprazole (PRILOSEC) 40 MG capsule Take 40 mg by mouth daily. 02/10/20   [provider]  promethazine-dextromethorphan (PROMETHAZINE-DM) 6.25-15 MG/5ML syrup Take 5 mLs by mouth 4 (four) times daily as needed. 06/30/22   Margaretann Loveless, PA-C  tiZANidine (ZANAFLEX) 4 MG tablet Take 1 tablet (4 mg total) by mouth at bedtime. 08/12/21   Wallis Bamberg, PA-C  GLIPIZIDE PO Take  by mouth.  02/04/20  [provider]      Allergies    Quetiapine, Buspirone, Sulfa antibiotics, Quetiapine fumarate, and Sulfamethoxazole-trimethoprim    Review of Systems   Review of Systems  Physical Exam Updated Vital Signs BP (!) 141/105 (BP Location: Right Arm)   Pulse (!) 111   Temp 97.6 F (36.4 C)   Resp 18   Ht 6' (1.829 m)   Wt 113.4 kg   SpO2 100%   BMI 33.91 kg/m  Physical Exam Constitutional:      General: He is not in acute distress. HENT:     Head: Normocephalic and atraumatic.  Eyes:     Conjunctiva/sclera: Conjunctivae normal.      Pupils: Pupils are equal, round, and reactive to light.  Cardiovascular:     Rate and Rhythm: Regular rhythm. Tachycardia present.  Pulmonary:     Effort: Pulmonary effort is normal. No respiratory distress.  Abdominal:     General: There is no distension.     Tenderness: There is no abdominal tenderness.  Skin:    General: Skin is warm and dry.  Neurological:     General: No focal deficit present.     Mental Status: He is alert. Mental status is at baseline.  Psychiatric:        Mood and Affect: Mood normal.        Behavior: Behavior normal.     ED Results / Procedures / Treatments   Labs (all labs ordered are listed, but only abnormal results are displayed) Labs Reviewed  BASIC METABOLIC PANEL - Abnormal; Notable for the following components:      Result Value   Sodium 131 (*)    Glucose, Bld 457 (*)    All other components within normal limits  CBC - Abnormal; Notable for the following components:   MCV 78.9 (*)    MCHC 36.1 (*)    All other components within normal limits  URINALYSIS, ROUTINE W REFLEX MICROSCOPIC - Abnormal; Notable for the following components:   Specific Gravity, Urine 1.032 (*)    Glucose, UA >1,000 (*)    Ketones, ur 15 (*)    All other components within normal limits  CBG MONITORING, ED - Abnormal; Notable for the following components:   Glucose-Capillary 451 (*)    All other components within normal limits  CBG MONITORING, ED - Abnormal; Notable for the following components:   Glucose-Capillary 361 (*)    All other components within normal limits    EKG EKG Interpretation  Date/Time:  Thursday October 02 2022 12:05:52 EDT Ventricular Rate:  108 PR Interval:  166 QRS Duration: 88 QT Interval:  350 QTC Calculation: 469 R Axis:   53 Text Interpretation: Sinus tachycardia No previous ECGs available Confirmed by Alvester Chou [id number]) on 10/02/2022 2:07:58 PM  Radiology No results found.  Procedures Procedures    Medications Ordered  in ED Medications  sodium chloride 0.9 % bolus 1,000 mL (1,000 mLs Intravenous New Bag/Given 10/02/22 1419)  insulin aspart (novoLOG) injection 6 Units (6 Units Subcutaneous Given 10/02/22 1446)    ED Course/ Medical Decision Making/ A&P                             Medical Decision Making Amount and/or Complexity of Data Reviewed Labs: ordered.  Risk OTC drugs. Prescription drug management.   This patient presents to the ED with concern for lightheadedness, hyperglycemia. This involves an extensive  number of treatment options, and is a complaint that carries with it a high risk of complications and morbidity.  The differential diagnosis includes arrhythmia versus anemia versus metabolic derangement versus other  Co-morbidities that complicate the patient evaluation: History of diabetes, poorly controlled  I ordered and personally interpreted labs.  The pertinent results include: No acute anemia.  White blood cell count within normal is.  Patient does have hyperglycemia glucose 457 perhaps of pseudohyponatremia, no anion gap.  UA with small ketones and elevated glucose.  Overall I suspect that the patient's hyperglycemia is related to missed medications at home, and emphasized importance of taking his insulin and oral medications every single day.  I am not can make any adjustments to his chronic insulin dosing or medications until he is consistently taking them, but he will need to follow-up with his doctor for this.   The patient was maintained on a cardiac monitor.  I personally viewed and interpreted the cardiac monitored which showed an underlying rhythm of: sinus rhythm  Per my interpretation the patient's ECG shows sinus tachycardia, no evident arrhythmia  I ordered medication including IV fluid and insulin for hyperglycemia  I have reviewed the patients home medicines and have made adjustments as needed  Test Considered: Low suspicion for acute PE, no acute risk factors.   Doubt pneumonia.  Doubt sepsis.  After the interventions noted above, I reevaluated the patient and found that they have: improved  Social Determinants of Health: again emphasized and educated importance of medical compliance  Dispostion:  After consideration of the diagnostic results and the patients response to treatment, I feel that the patent would benefit from outpatient PCP follow-up.         Final Clinical Impression(s) / ED Diagnoses Final diagnoses:  Hyperglycemia  Lightheadedness    Rx / DC Orders ED Discharge Orders     None         Terald Sleeper, MD 10/02/22 [phone]

## 2022-10-08 ENCOUNTER — Ambulatory Visit: Payer: BC Managed Care – PPO | Admitting: Family Medicine

## 2022-10-08 ENCOUNTER — Encounter: Payer: Self-pay | Admitting: Family Medicine

## 2022-10-08 VITALS — BP 129/91 | HR 102 | Temp 98.1°F | Resp 16 | Wt 240.8 lb

## 2022-10-08 DIAGNOSIS — Z794 Long term (current) use of insulin: Secondary | ICD-10-CM

## 2022-10-08 DIAGNOSIS — I10 Essential (primary) hypertension: Secondary | ICD-10-CM | POA: Diagnosis not present

## 2022-10-08 DIAGNOSIS — Z7984 Long term (current) use of oral hypoglycemic drugs: Secondary | ICD-10-CM | POA: Diagnosis not present

## 2022-10-08 DIAGNOSIS — E1165 Type 2 diabetes mellitus with hyperglycemia: Secondary | ICD-10-CM

## 2022-10-08 DIAGNOSIS — E781 Pure hyperglyceridemia: Secondary | ICD-10-CM

## 2022-10-08 LAB — POCT GLYCOSYLATED HEMOGLOBIN (HGB A1C): Hemoglobin A1C: 13.4 % — AB (ref 4.0–5.6)

## 2022-10-08 MED ORDER — LISINOPRIL 20 MG PO TABS
20.0000 mg | ORAL_TABLET | Freq: Every day | ORAL | 2 refills | Status: DC
Start: 2022-10-08 — End: 2023-02-03

## 2022-10-08 MED ORDER — FREESTYLE LIBRE 3 SENSOR MISC: 5 refills | Status: AC

## 2022-10-10 ENCOUNTER — Encounter: Payer: Self-pay | Admitting: Family Medicine

## 2022-10-10 NOTE — Progress Notes (Signed)
Established Patient Office Visit  Subjective    Patient ID: Mark Lindsey, male    DOB: [birth year]  Age: 42 y.o. MRN: [id number]  CC: No chief complaint on file.   HPI Mark Lindsey presents for routine follow up of chronic med issues. Patient denies acute complaints or concerns.    Outpatient Encounter Medications as of 10/08/2022  Medication Sig   Continuous Glucose Sensor (FREESTYLE LIBRE 3 SENSOR) MISC Place 1 sensor on the skin every 14 days. Use to check glucose continuously   glimepiride (AMARYL) 2 MG tablet TAKE 1 TABLET BY MOUTH 30 MINUTES BEFORE BREAKFAST   insulin isophane & regular human KwikPen (NOVOLIN 70/30 KWIKPEN) (70-30) 100 UNIT/ML KwikPen Inject into the skin.   levocetirizine (XYZAL) 5 MG tablet    LORazepam (ATIVAN) 0.5 MG tablet SMARTSIG:0.5 Tablet(s) By Mouth PRN   Accu-Chek FastClix Lancets MISC Use to monitor blood glucose 3  time(s) daily. ICD10:E11.69   amoxicillin-clavulanate (AUGMENTIN) 875-125 MG tablet Take 1 tablet by mouth 2 (two) times daily.   atorvastatin (LIPITOR) 20 MG tablet Take 1 tablet (20 mg total) by mouth daily.   benzonatate (TESSALON) 100 MG capsule Take 1 capsule (100 mg total) by mouth 3 (three) times daily as needed.   fluticasone (FLONASE) 50 MCG/ACT nasal spray Place 2 sprays into both nostrils daily.   glucose blood (PRECISION QID TEST) test strip 1 each by Other route.   hydrOXYzine (ATARAX) 10 MG tablet Take 10 mg by mouth as needed for anxiety.   ibuprofen (ADVIL) 600 MG tablet Take 1 tablet (600 mg total) by mouth every 8 (eight) hours as needed.   ibuprofen (ADVIL) 600 MG tablet Take 1 tablet (600 mg total) by mouth every 6 (six) hours as needed.   Insulin Glargine (BASAGLAR KWIKPEN) 100 UNIT/ML Inject 10 Units into the skin at bedtime.   Insulin Pen Needle (PEN NEEDLES) 32G X 4 MM MISC 1 each by Does not apply route at bedtime.   lamoTRIgine (LAMICTAL) 150 MG tablet Take 150 mg by mouth daily.   lisinopril  (ZESTRIL) 20 MG tablet Take 1 tablet (20 mg total) by mouth daily.   metFORMIN (GLUCOPHAGE-XR) 500 MG 24 hr tablet Take 2 tablets (1,000 mg total) by mouth 2 (two) times daily.   omeprazole (PRILOSEC) 40 MG capsule Take 40 mg by mouth daily.   promethazine-dextromethorphan (PROMETHAZINE-DM) 6.25-15 MG/5ML syrup Take 5 mLs by mouth 4 (four) times daily as needed.   tiZANidine (ZANAFLEX) 4 MG tablet Take 1 tablet (4 mg total) by mouth at bedtime.   [DISCONTINUED] GLIPIZIDE PO Take by mouth.   [DISCONTINUED] glucose blood (PRECISION QID TEST) test strip 1 each by Other route.   [DISCONTINUED] lisinopril (ZESTRIL) 20 MG tablet Take 1 tablet (20 mg total) by mouth daily.   No facility-administered encounter medications on file as of 10/08/2022.    Past Medical History:  Diagnosis Date   Bipolar disease, chronic (HCC)    Diabetes mellitus without complication (HCC)    Hypertension    Kidney stones    Renal disorder     No past surgical history on file.  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       Objective    BP (!) 129/91   Pulse (!) 102   Temp 98.1 F (36.7 C) (Oral)   Resp 16   Wt 240 lb 12.8 oz (109.2 kg)   SpO2 96%   BMI 32.66 kg/m   Physical Exam Vitals and nursing note reviewed.  Constitutional:      General: He is not in acute distress. Cardiovascular:     Rate and Rhythm: Normal rate and regular rhythm.  Pulmonary:     Effort: Pulmonary effort is normal.     Breath sounds: Normal breath sounds.  Abdominal:     Palpations: Abdomen is soft.     Tenderness: There is no abdominal tenderness.  Neurological:     General: No focal deficit present.     Mental Status: He is alert and oriented to person, place, and time.         Assessment & Plan:  1. Type 2 diabetes mellitus with hyperglycemia, unspecified whether long term insulin use (HCC) Elevated A1c and far above goal. Referral to [facility] for med management. Discussed dietary and activity options.  - POCT glycosylated hemoglobin (Hb A1C)  2. Primary hypertension Continue and monitor - lisinopril (ZESTRIL) 20 MG tablet; Take 1 tablet (20 mg total) by mouth daily.  Dispense: 30 tablet; Refill: 2  3. Hypertriglyceridemia Continue    No follow-ups on file.   Tommie Raymond, MD

## 2022-11-04 ENCOUNTER — Institutional Professional Consult (permissible substitution) (HOSPITAL_BASED_OUTPATIENT_CLINIC_OR_DEPARTMENT_OTHER): Payer: BC Managed Care – PPO | Admitting: Internal Medicine

## 2022-11-04 ENCOUNTER — Ambulatory Visit: Payer: BC Managed Care – PPO | Admitting: Sports Medicine

## 2022-11-04 ENCOUNTER — Encounter (HOSPITAL_BASED_OUTPATIENT_CLINIC_OR_DEPARTMENT_OTHER): Payer: Self-pay

## 2022-11-13 ENCOUNTER — Ambulatory Visit: Payer: BC Managed Care – PPO | Admitting: Orthopedic Surgery

## 2022-11-19 ENCOUNTER — Ambulatory Visit (HOSPITAL_BASED_OUTPATIENT_CLINIC_OR_DEPARTMENT_OTHER): Payer: BC Managed Care – PPO | Admitting: Orthopaedic Surgery

## 2022-12-16 ENCOUNTER — Encounter: Payer: BC Managed Care – PPO | Admitting: Family

## 2022-12-18 DIAGNOSIS — J069 Acute upper respiratory infection, unspecified: Secondary | ICD-10-CM | POA: Diagnosis not present

## 2022-12-21 ENCOUNTER — Other Ambulatory Visit: Payer: Self-pay | Admitting: Family

## 2022-12-21 DIAGNOSIS — E1165 Type 2 diabetes mellitus with hyperglycemia: Secondary | ICD-10-CM

## 2022-12-24 ENCOUNTER — Other Ambulatory Visit: Payer: Self-pay | Admitting: Family

## 2022-12-24 DIAGNOSIS — E1165 Type 2 diabetes mellitus with hyperglycemia: Secondary | ICD-10-CM

## 2022-12-24 MED ORDER — METFORMIN HCL ER 500 MG PO TB24
1000.0000 mg | ORAL_TABLET | Freq: Two times a day (BID) | ORAL | 2 refills | Status: AC
Start: 2022-12-24 — End: 2023-03-24

## 2022-12-24 NOTE — Telephone Encounter (Signed)
 Complete. Schedule appointment.

## 2023-01-02 ENCOUNTER — Other Ambulatory Visit: Payer: Self-pay | Admitting: Family Medicine

## 2023-01-02 DIAGNOSIS — I10 Essential (primary) hypertension: Secondary | ICD-10-CM

## 2023-01-02 NOTE — Telephone Encounter (Signed)
Requested medication (s) are due for refill today: yes  Requested medication (s) are on the active medication list: on list but ended  Last refill:  10/08/22  Future visit scheduled: no  Notes to clinic:  prescription end date 01/06/23- can this med be refilled?   Requested Prescriptions  Pending Prescriptions Disp Refills   lisinopril (ZESTRIL) 20 MG tablet [Pharmacy Med Name: LISINOPRIL 20 MG TABLET] 90 tablet     Sig: TAKE 1 TABLET BY MOUTH EVERY DAY     Cardiovascular:  ACE Inhibitors Failed - 01/02/2023  1:32 AM      Failed - Last BP in normal range    BP Readings from Last 1 Encounters:  10/08/22 (!) 129/91         Passed - Cr in normal range and within 180 days    Creatinine, Ser  Date Value Ref Range Status  10/02/2022 0.83 0.61 - 1.24 mg/dL Final         Passed - K in normal range and within 180 days    Potassium  Date Value Ref Range Status  10/02/2022 3.9 3.5 - 5.1 mmol/L Final         Passed - Patient is not pregnant      Passed - Valid encounter within last 6 months    Recent Outpatient Visits           2 months ago Type 2 diabetes mellitus with hyperglycemia, unspecified whether long term insulin use (HCC)   [facility], MD   8 months ago Encounter to establish care   [facility], Salomon Fick, NP

## 2023-02-03 ENCOUNTER — Other Ambulatory Visit: Payer: Self-pay

## 2023-02-03 DIAGNOSIS — I10 Essential (primary) hypertension: Secondary | ICD-10-CM

## 2023-02-03 MED ORDER — LISINOPRIL 20 MG PO TABS
20.0000 mg | ORAL_TABLET | Freq: Every day | ORAL | 0 refills | Status: AC
Start: 2023-02-03 — End: ?

## 2023-07-29 ENCOUNTER — Other Ambulatory Visit: Payer: Self-pay | Admitting: Family

## 2023-07-29 DIAGNOSIS — E781 Pure hyperglyceridemia: Secondary | ICD-10-CM

## 2023-11-13 ENCOUNTER — Other Ambulatory Visit: Payer: Self-pay | Admitting: Family Medicine

## 2023-11-13 DIAGNOSIS — I10 Essential (primary) hypertension: Secondary | ICD-10-CM

## 2023-11-13 NOTE — Telephone Encounter (Signed)
 Last office visit 10/08/2022. Schedule appointment. During the interim report to Emergency Department/Urgent Care/call 911 for immediate medical evaluation.

## 2023-11-13 NOTE — Telephone Encounter (Signed)
 Last office visit 10/08/2022. Schedule appointment. During the interim report to Emergency Department/call 911 for immediate medical evaluation.

## 2023-11-27 NOTE — Telephone Encounter (Signed)
 Schedule appointment for Lisinopril  refill. Last office visit 10/08/2022. During the interim report to the Emergency Department/Urgent Care/call 911 for immediate medical evaluation.

## 2023-12-01 NOTE — Telephone Encounter (Signed)
 Appt scheduled

## 2023-12-08 NOTE — Telephone Encounter (Signed)
 Schedule appointment. Last office visit on 10/08/2022 with Raguel Blush, MD. During the interim report to the Emergency Department/Urgent Care/call 911 for immediate medical evaluation.

## 2023-12-09 NOTE — Telephone Encounter (Signed)
 Appt scheduled

## 2023-12-10 NOTE — Telephone Encounter (Signed)
 Noted

## 2023-12-10 NOTE — Telephone Encounter (Signed)
 Please do not reschedule appt per patient

## 2023-12-19 IMAGING — DX DG CERVICAL SPINE COMPLETE 4+V
6 series · 6 of 6 positions shown · non-contrast
Comparison: None.

CLINICAL DATA: Neck pain

EXAM:
CERVICAL SPINE - COMPLETE 4+ VIEW

[cervical spine ap (1 of 3)]
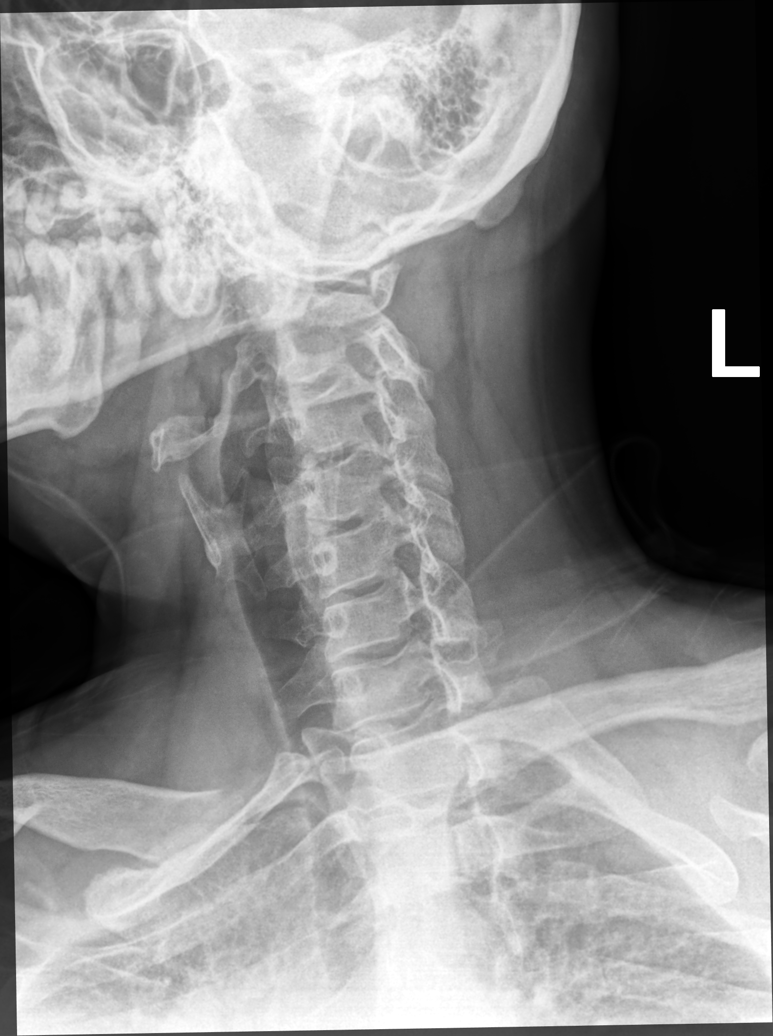

[cervical spine lat (1 of 3)]
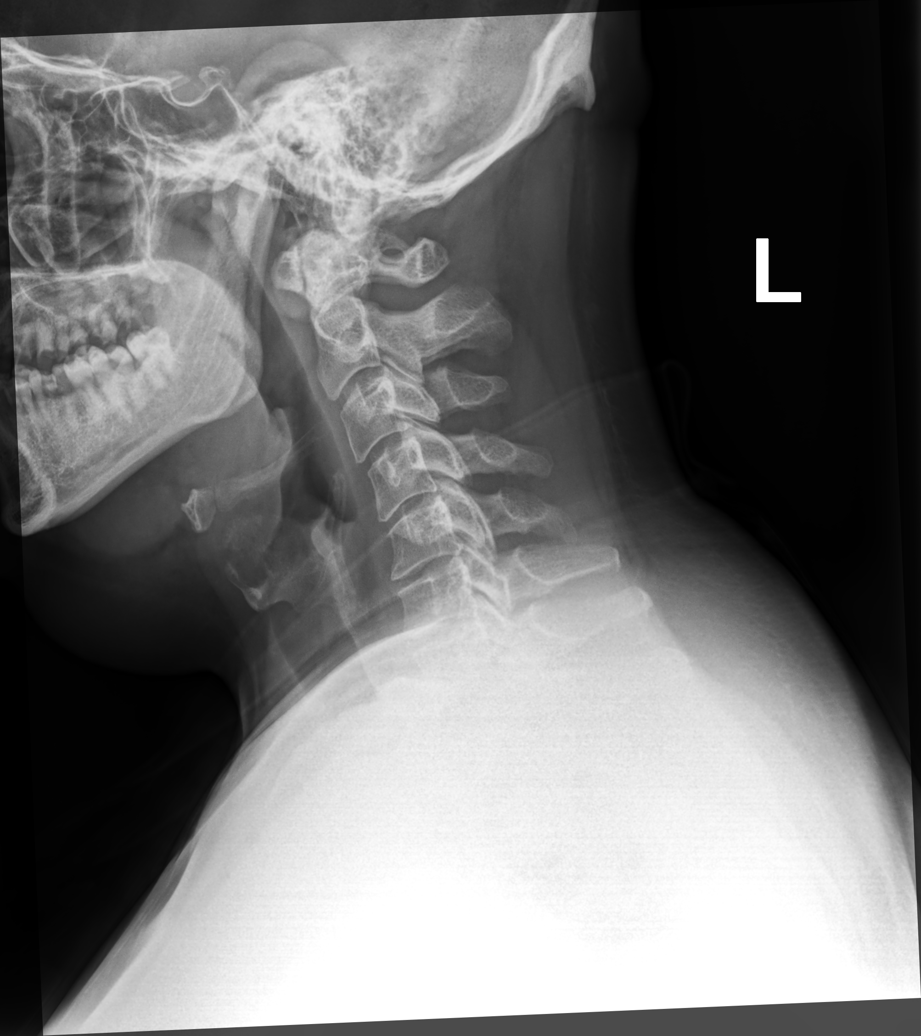

[cervical spine ap (2 of 3)]
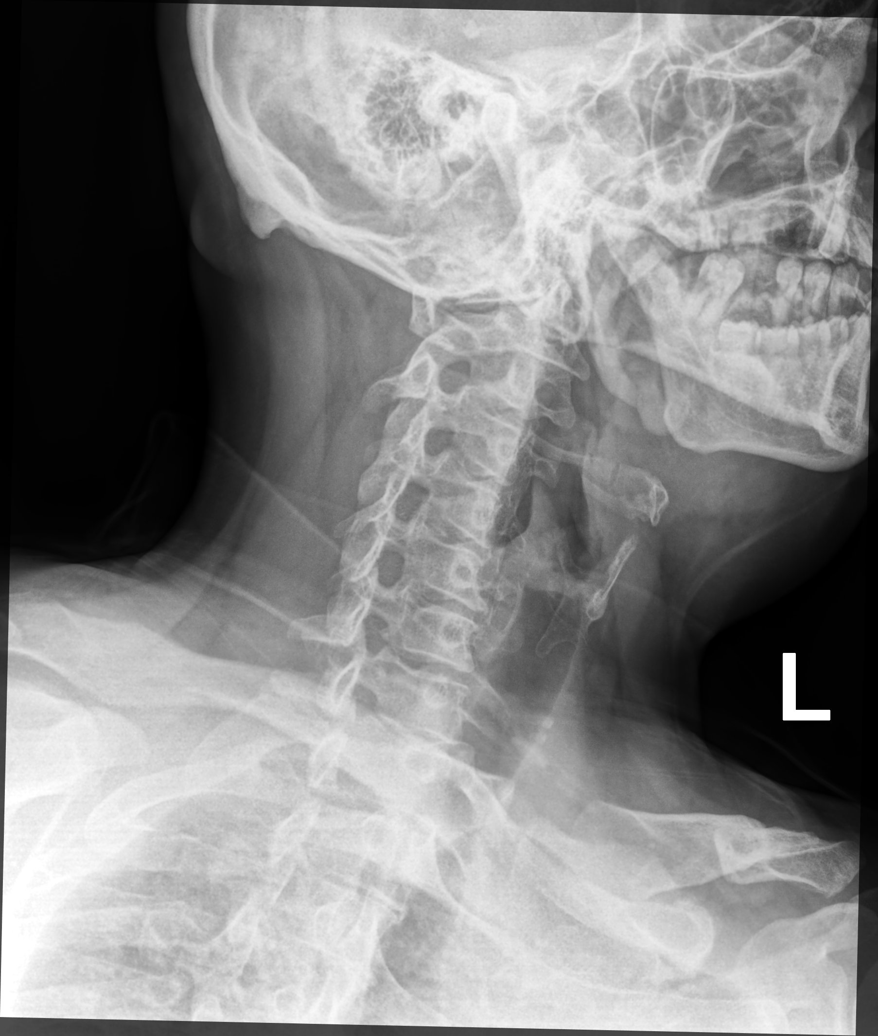

[cervical spine lat (2 of 3)]
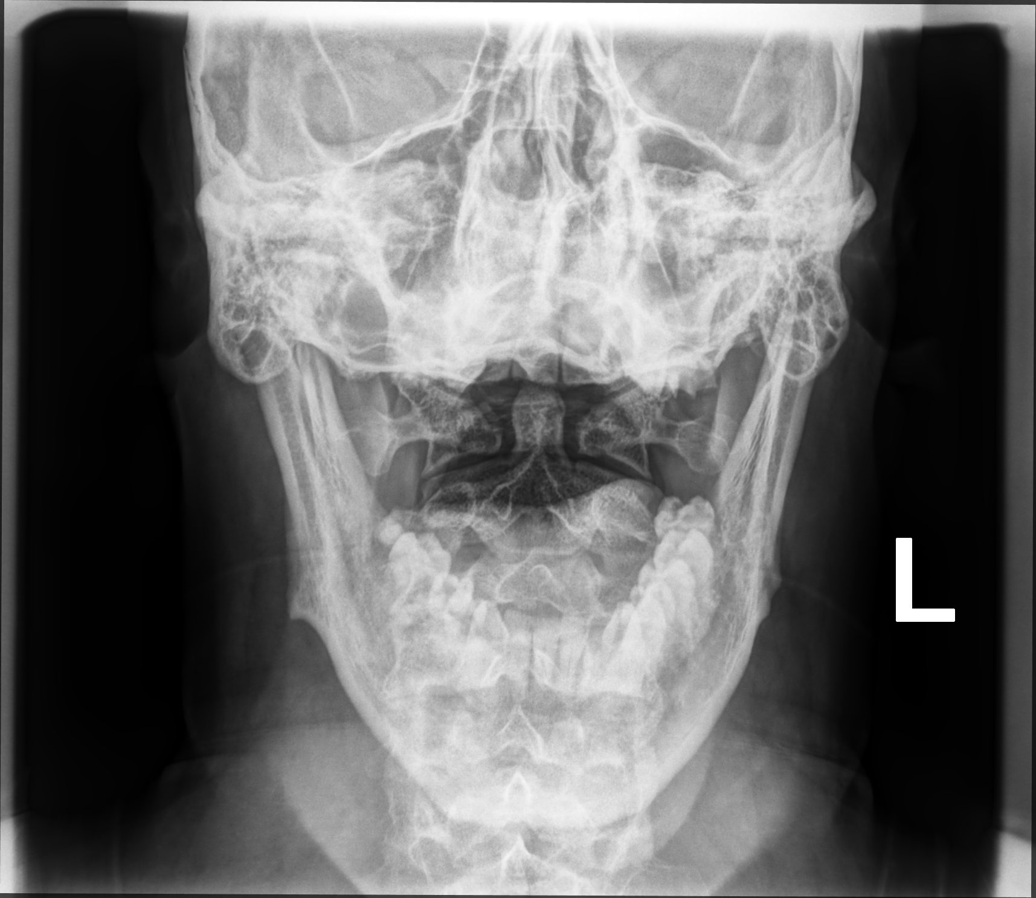

[cervical spine ap (3 of 3)]
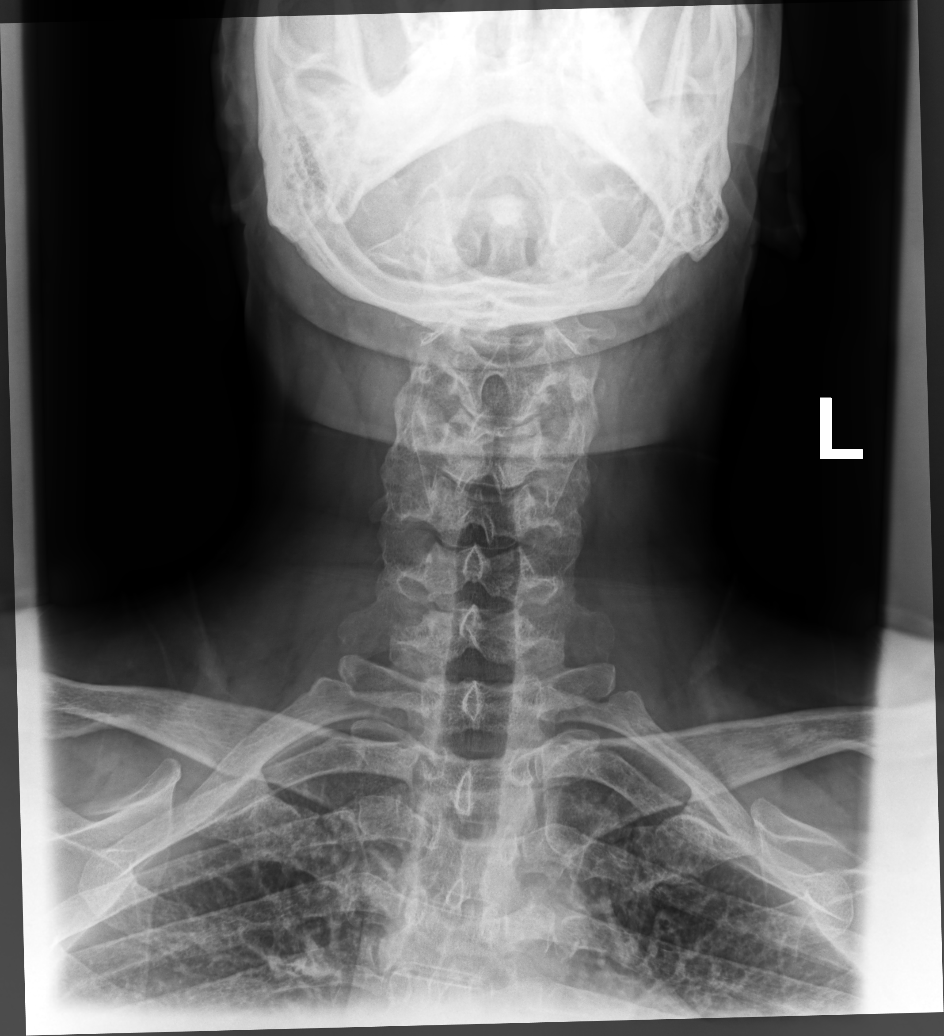

[cervical spine lat (3 of 3)]
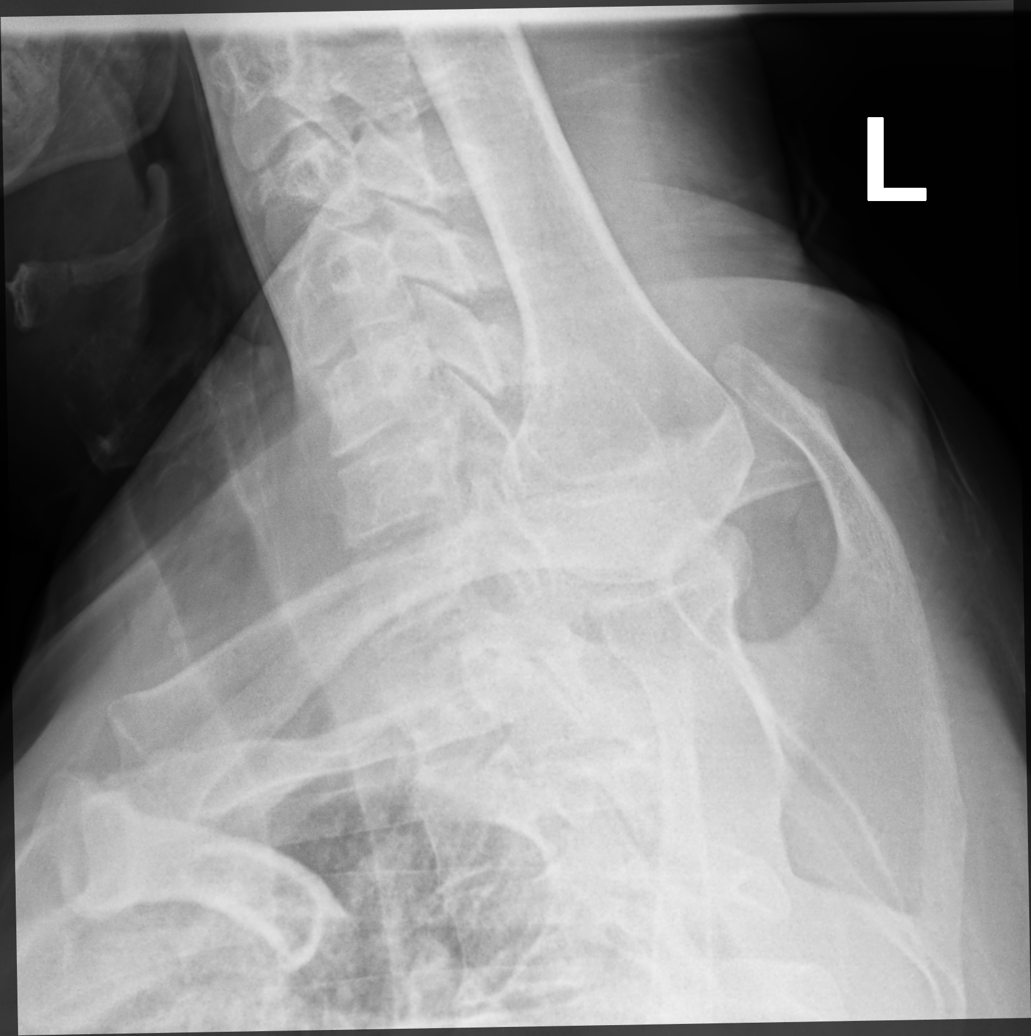

[6 of 6 positions shown; findings below may reference images not displayed]

FINDINGS: Mild reversal of cervical lordosis. Suboptimal visualization of C7
and cervicothoracic junction. Normal prevertebral soft tissue
thickness. Mild disc space narrowing and degenerative change C4-C5
and C5-C6. No high-grade foraminal narrowing. The dens and lateral
masses are within normal limits.
IMPRESSION: Mild reversal of cervical lordosis with mild degenerative changes

## 2024-01-06 ENCOUNTER — Ambulatory Visit: Admitting: Family

## 2024-01-15 ENCOUNTER — Ambulatory Visit: Admitting: Family
